# Patient Record
Sex: Female | Born: 1960 | Race: Black or African American | Hispanic: No | Marital: Single | Smoking: Former smoker
Health system: Southern US, Community
[De-identification: ages and names within clinical notes are randomized; demographics above are authoritative.]

## PROBLEM LIST (undated history)

## (undated) DIAGNOSIS — I10 Essential (primary) hypertension: Secondary | ICD-10-CM

## (undated) DIAGNOSIS — J189 Pneumonia, unspecified organism: Secondary | ICD-10-CM

## (undated) DIAGNOSIS — E7800 Pure hypercholesterolemia, unspecified: Secondary | ICD-10-CM

## (undated) HISTORY — PX: TONSILLECTOMY: SUR1361

## (undated) HISTORY — PX: TUBAL LIGATION: SHX77

---

## 2013-02-23 ENCOUNTER — Emergency Department (HOSPITAL_BASED_OUTPATIENT_CLINIC_OR_DEPARTMENT_OTHER): Payer: No Typology Code available for payment source

## 2013-02-23 ENCOUNTER — Emergency Department (HOSPITAL_BASED_OUTPATIENT_CLINIC_OR_DEPARTMENT_OTHER)
Admission: EM | Admit: 2013-02-23 | Discharge: 2013-02-23 | Disposition: A | Discharge status: Home / Self Care | Payer: No Typology Code available for payment source | Attending: Emergency Medicine | Admitting: Emergency Medicine

## 2013-02-23 ENCOUNTER — Encounter (HOSPITAL_BASED_OUTPATIENT_CLINIC_OR_DEPARTMENT_OTHER): Payer: Self-pay | Admitting: Emergency Medicine

## 2013-02-23 DIAGNOSIS — Y9389 Activity, other specified: Secondary | ICD-10-CM | POA: Insufficient documentation

## 2013-02-23 DIAGNOSIS — Z9851 Tubal ligation status: Secondary | ICD-10-CM | POA: Insufficient documentation

## 2013-02-23 DIAGNOSIS — S3981XA Other specified injuries of abdomen, initial encounter: Secondary | ICD-10-CM | POA: Insufficient documentation

## 2013-02-23 DIAGNOSIS — E7800 Pure hypercholesterolemia, unspecified: Secondary | ICD-10-CM | POA: Insufficient documentation

## 2013-02-23 DIAGNOSIS — S46909A Unspecified injury of unspecified muscle, fascia and tendon at shoulder and upper arm level, unspecified arm, initial encounter: Secondary | ICD-10-CM | POA: Insufficient documentation

## 2013-02-23 DIAGNOSIS — Z91040 Latex allergy status: Secondary | ICD-10-CM | POA: Insufficient documentation

## 2013-02-23 DIAGNOSIS — S4980XA Other specified injuries of shoulder and upper arm, unspecified arm, initial encounter: Secondary | ICD-10-CM | POA: Insufficient documentation

## 2013-02-23 DIAGNOSIS — Z79899 Other long term (current) drug therapy: Secondary | ICD-10-CM | POA: Insufficient documentation

## 2013-02-23 DIAGNOSIS — Y92410 Unspecified street and highway as the place of occurrence of the external cause: Secondary | ICD-10-CM | POA: Insufficient documentation

## 2013-02-23 HISTORY — DX: Pure hypercholesterolemia, unspecified: E78.00

## 2013-02-23 LAB — COMPREHENSIVE METABOLIC PANEL
ALK PHOS: 92 U/L (ref 39–117)
ALT: 8 U/L (ref 0–35)
AST: 11 U/L (ref 0–37)
Albumin: 3.5 g/dL (ref 3.5–5.2)
BILIRUBIN TOTAL: 0.2 mg/dL — AB (ref 0.3–1.2)
BUN: 9 mg/dL (ref 6–23)
CALCIUM: 9.1 mg/dL (ref 8.4–10.5)
CO2: 23 meq/L (ref 19–32)
Chloride: 103 mEq/L (ref 96–112)
Creatinine, Ser: 0.8 mg/dL (ref 0.50–1.10)
GFR, EST NON AFRICAN AMERICAN: 85 mL/min — AB (ref >90)
GLUCOSE: 103 mg/dL — AB (ref 70–99)
Potassium: 4.1 mEq/L (ref 3.7–5.3)
SODIUM: 140 meq/L (ref 137–147)
Total Protein: 7.5 g/dL (ref 6.0–8.3)

## 2013-02-23 LAB — URINALYSIS, ROUTINE W REFLEX MICROSCOPIC
BILIRUBIN URINE: NEGATIVE
Glucose, UA: NEGATIVE mg/dL
Hgb urine dipstick: NEGATIVE
KETONES UR: NEGATIVE mg/dL
Leukocytes, UA: NEGATIVE
Nitrite: NEGATIVE
Protein, ur: NEGATIVE mg/dL
SPECIFIC GRAVITY, URINE: 1.018 (ref 1.005–1.030)
UROBILINOGEN UA: 1 mg/dL (ref 0.0–1.0)
pH: 6 (ref 5.0–8.0)

## 2013-02-23 LAB — CBC WITH DIFFERENTIAL/PLATELET
Basophils Absolute: 0 10*3/uL (ref 0.0–0.1)
Basophils Relative: 0 % (ref 0–1)
EOS PCT: 2 % (ref 0–5)
Eosinophils Absolute: 0.1 10*3/uL (ref 0.0–0.7)
HEMATOCRIT: 37.9 % (ref 36.0–46.0)
Hemoglobin: 12.4 g/dL (ref 12.0–15.0)
LYMPHS PCT: 47 % — AB (ref 12–46)
Lymphs Abs: 3.3 10*3/uL (ref 0.7–4.0)
MCH: 29.7 pg (ref 26.0–34.0)
MCHC: 32.7 g/dL (ref 30.0–36.0)
MCV: 90.9 fL (ref 78.0–100.0)
Monocytes Absolute: 0.6 10*3/uL (ref 0.1–1.0)
Monocytes Relative: 8 % (ref 3–12)
Neutro Abs: 3 10*3/uL (ref 1.7–7.7)
Neutrophils Relative %: 43 % (ref 43–77)
PLATELETS: 392 10*3/uL (ref 150–400)
RBC: 4.17 MIL/uL (ref 3.87–5.11)
RDW: 14.9 % (ref 11.5–15.5)
WBC: 6.9 10*3/uL (ref 4.0–10.5)

## 2013-02-23 MED ORDER — IOHEXOL 300 MG/ML  SOLN
100.0000 mL | Freq: Once | INTRAMUSCULAR | Status: AC | PRN
  Administered 2013-02-23: 100 mL via INTRAVENOUS

## 2013-02-23 MED ORDER — HYDROCODONE-ACETAMINOPHEN 5-325 MG PO TABS: 2.0000 | ORAL_TABLET | Freq: Four times a day (QID) | ORAL | Status: DC | PRN

## 2013-02-23 MED ORDER — IBUPROFEN 800 MG PO TABS
800.0000 mg | ORAL_TABLET | Freq: Once | ORAL | Status: AC
  Administered 2013-02-23: 800 mg via ORAL
  Filled 2013-02-23: qty 1

## 2013-02-23 MED ORDER — IBUPROFEN 800 MG PO TABS: 800.0000 mg | ORAL_TABLET | Freq: Three times a day (TID) | ORAL | Status: DC | PRN
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
## 2013-02-23 NOTE — ED Notes (Signed)
Patient transported to CT 
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
## 2014-04-02 ENCOUNTER — Encounter (HOSPITAL_BASED_OUTPATIENT_CLINIC_OR_DEPARTMENT_OTHER): Payer: Self-pay | Admitting: *Deleted

## 2014-04-02 ENCOUNTER — Emergency Department (HOSPITAL_BASED_OUTPATIENT_CLINIC_OR_DEPARTMENT_OTHER)
Admission: EM | Admit: 2014-04-02 | Discharge: 2014-04-02 | Disposition: A | Discharge status: Home / Self Care | Payer: Medicaid Other | Attending: Emergency Medicine | Admitting: Emergency Medicine

## 2014-04-02 DIAGNOSIS — R51 Headache: Secondary | ICD-10-CM

## 2014-04-02 DIAGNOSIS — I10 Essential (primary) hypertension: Secondary | ICD-10-CM | POA: Diagnosis not present

## 2014-04-02 DIAGNOSIS — Z8639 Personal history of other endocrine, nutritional and metabolic disease: Secondary | ICD-10-CM | POA: Diagnosis not present

## 2014-04-02 DIAGNOSIS — Z87891 Personal history of nicotine dependence: Secondary | ICD-10-CM | POA: Diagnosis not present

## 2014-04-02 DIAGNOSIS — Y9389 Activity, other specified: Secondary | ICD-10-CM | POA: Diagnosis not present

## 2014-04-02 DIAGNOSIS — R519 Headache, unspecified: Secondary | ICD-10-CM

## 2014-04-02 DIAGNOSIS — S0990XA Unspecified injury of head, initial encounter: Secondary | ICD-10-CM | POA: Diagnosis not present

## 2014-04-02 DIAGNOSIS — Z791 Long term (current) use of non-steroidal anti-inflammatories (NSAID): Secondary | ICD-10-CM | POA: Insufficient documentation

## 2014-04-02 DIAGNOSIS — Y92410 Unspecified street and highway as the place of occurrence of the external cause: Secondary | ICD-10-CM | POA: Diagnosis not present

## 2014-04-02 DIAGNOSIS — S4991XA Unspecified injury of right shoulder and upper arm, initial encounter: Secondary | ICD-10-CM | POA: Insufficient documentation

## 2014-04-02 DIAGNOSIS — Z79899 Other long term (current) drug therapy: Secondary | ICD-10-CM | POA: Diagnosis not present

## 2014-04-02 DIAGNOSIS — Z91040 Latex allergy status: Secondary | ICD-10-CM | POA: Insufficient documentation

## 2014-04-02 DIAGNOSIS — Y998 Other external cause status: Secondary | ICD-10-CM | POA: Insufficient documentation

## 2014-04-02 DIAGNOSIS — M79601 Pain in right arm: Secondary | ICD-10-CM

## 2014-04-02 HISTORY — DX: Essential (primary) hypertension: I10

## 2014-04-02 MED ORDER — IBUPROFEN 800 MG PO TABS
800.0000 mg | ORAL_TABLET | Freq: Once | ORAL | Status: AC
  Administered 2014-04-02: 800 mg via ORAL
  Filled 2014-04-02: qty 1

## 2014-04-02 MED ORDER — IBUPROFEN 800 MG PO TABS: 800.0000 mg | ORAL_TABLET | Freq: Three times a day (TID) | ORAL | Status: DC
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
## 2015-06-22 ENCOUNTER — Encounter (HOSPITAL_BASED_OUTPATIENT_CLINIC_OR_DEPARTMENT_OTHER): Payer: Self-pay | Admitting: *Deleted

## 2015-06-22 ENCOUNTER — Emergency Department (HOSPITAL_BASED_OUTPATIENT_CLINIC_OR_DEPARTMENT_OTHER)
Admission: EM | Admit: 2015-06-22 | Discharge: 2015-06-22 | Disposition: A | Discharge status: Home / Self Care | Payer: Medicaid Other | Attending: Emergency Medicine | Admitting: Emergency Medicine

## 2015-06-22 DIAGNOSIS — E7800 Pure hypercholesterolemia, unspecified: Secondary | ICD-10-CM | POA: Diagnosis not present

## 2015-06-22 DIAGNOSIS — Y999 Unspecified external cause status: Secondary | ICD-10-CM | POA: Diagnosis not present

## 2015-06-22 DIAGNOSIS — Z87891 Personal history of nicotine dependence: Secondary | ICD-10-CM | POA: Diagnosis not present

## 2015-06-22 DIAGNOSIS — Y92410 Unspecified street and highway as the place of occurrence of the external cause: Secondary | ICD-10-CM | POA: Diagnosis not present

## 2015-06-22 DIAGNOSIS — I10 Essential (primary) hypertension: Secondary | ICD-10-CM | POA: Insufficient documentation

## 2015-06-22 DIAGNOSIS — Y939 Activity, unspecified: Secondary | ICD-10-CM | POA: Diagnosis not present

## 2015-06-22 DIAGNOSIS — R51 Headache: Secondary | ICD-10-CM | POA: Insufficient documentation

## 2015-06-22 DIAGNOSIS — R519 Headache, unspecified: Secondary | ICD-10-CM

## 2015-06-22 MED ORDER — NAPROXEN 500 MG PO TABS: 500.0000 mg | ORAL_TABLET | Freq: Two times a day (BID) | ORAL | Status: DC

## 2015-06-22 MED ORDER — METHOCARBAMOL 500 MG PO TABS: 500.0000 mg | ORAL_TABLET | Freq: Two times a day (BID) | ORAL | Status: DC
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
## 2015-06-22 NOTE — Discharge Instructions (Signed)
Take the prescribed medication as directed. °Follow-up with your primary care doctor. °Return to the ED for new or worsening symptoms. °
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
## 2016-04-04 ENCOUNTER — Encounter (HOSPITAL_BASED_OUTPATIENT_CLINIC_OR_DEPARTMENT_OTHER): Payer: Self-pay

## 2016-04-04 ENCOUNTER — Emergency Department (HOSPITAL_BASED_OUTPATIENT_CLINIC_OR_DEPARTMENT_OTHER)
Admission: EM | Admit: 2016-04-04 | Discharge: 2016-04-04 | Disposition: A | Discharge status: Home / Self Care | Payer: Medicaid Other | Attending: Emergency Medicine | Admitting: Emergency Medicine

## 2016-04-04 DIAGNOSIS — Z23 Encounter for immunization: Secondary | ICD-10-CM | POA: Diagnosis not present

## 2016-04-04 DIAGNOSIS — Y939 Activity, unspecified: Secondary | ICD-10-CM | POA: Insufficient documentation

## 2016-04-04 DIAGNOSIS — S61210A Laceration without foreign body of right index finger without damage to nail, initial encounter: Secondary | ICD-10-CM | POA: Insufficient documentation

## 2016-04-04 DIAGNOSIS — S61011A Laceration without foreign body of right thumb without damage to nail, initial encounter: Secondary | ICD-10-CM | POA: Insufficient documentation

## 2016-04-04 DIAGNOSIS — I10 Essential (primary) hypertension: Secondary | ICD-10-CM | POA: Insufficient documentation

## 2016-04-04 DIAGNOSIS — Y92009 Unspecified place in unspecified non-institutional (private) residence as the place of occurrence of the external cause: Secondary | ICD-10-CM | POA: Diagnosis not present

## 2016-04-04 DIAGNOSIS — Y999 Unspecified external cause status: Secondary | ICD-10-CM | POA: Diagnosis not present

## 2016-04-04 DIAGNOSIS — Z87891 Personal history of nicotine dependence: Secondary | ICD-10-CM | POA: Diagnosis not present

## 2016-04-04 DIAGNOSIS — W268XXA Contact with other sharp object(s), not elsewhere classified, initial encounter: Secondary | ICD-10-CM | POA: Diagnosis not present

## 2016-04-04 DIAGNOSIS — S6991XA Unspecified injury of right wrist, hand and finger(s), initial encounter: Secondary | ICD-10-CM | POA: Diagnosis present

## 2016-04-04 DIAGNOSIS — S61411A Laceration without foreign body of right hand, initial encounter: Secondary | ICD-10-CM

## 2016-04-04 MED ORDER — TETANUS-DIPHTH-ACELL PERTUSSIS 5-2.5-18.5 LF-MCG/0.5 IM SUSP
0.5000 mL | Freq: Once | INTRAMUSCULAR | Status: AC
Start: 2016-04-04 — End: 2016-04-04
  Administered 2016-04-04: 0.5 mL via INTRAMUSCULAR
  Filled 2016-04-04: qty 0.5
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
## 2016-04-04 NOTE — ED Provider Notes (Signed)
[facility]   CSN: [id number] Arrival date & time: 04/04/16  1302     History   Chief Complaint Chief Complaint  Patient presents with  . Hand Injury    HPI Hannah Simpson is a 56 y.o. female.  HPI 56 y.o. female, presents to the Emergency Department today complaining of cutting right hand on door knob PTA this morning. Notes laceration on thumb and index finger. Bleeding controlled with direct pressure. No decrease in ROM. No numbness/tingling. No glass shards noted in wound. Notes minimal pain currently. No meds PTA.   Past Medical History:  Diagnosis Date  . Hypercholesteremia   . Hypertension     There are no active problems to display for this patient.   Past Surgical History:  Procedure Laterality Date  . TUBAL LIGATION      OB History    No data available       Home Medications    Prior to Admission medications   Not on File    Family History No family history on file.  Social History Social History  Substance Use Topics  . Smoking status: Former Games developer  . Smokeless tobacco: Never Used  . Alcohol use No     Allergies   Latex   Review of Systems Review of Systems  Constitutional: Negative for fever.  Skin: Positive for wound.  Neurological: Negative for numbness.   Physical Exam Updated Vital Signs BP 141/85 (BP Location: Left Arm)   Pulse (!) 55   Temp 98.5 F (36.9 C) (Oral)   Resp 18   Ht 5\' 9"  (1.753 m)   Wt 96.2 kg   LMP 03/28/2016   SpO2 100%   BMI 31.31 kg/m   Physical Exam  Constitutional: She is oriented to person, place, and time. Vital signs are normal. She appears well-developed and well-nourished.  HENT:  Head: Normocephalic.  Right Ear: Hearing normal.  Left Ear: Hearing normal.  Eyes: Conjunctivae and EOM are normal. Pupils are equal, round, and reactive to light.  Cardiovascular: Normal rate and regular rhythm.   Pulmonary/Chest: Effort normal.  Musculoskeletal:  Right Index and  Thumb with small 1 cm superficial lacerations that are well approximated. No foreign bodies noted. No bleeding. ROM intact. NVI. Cap refill <2sec.   Neurological: She is alert and oriented to person, place, and time.  Skin: Skin is warm and dry.  Psychiatric: She has a normal mood and affect. Her speech is normal and behavior is normal. Thought content normal.  Nursing note and vitals reviewed.  ED Treatments / Results  Labs (all labs ordered are listed, but only abnormal results are displayed) Labs Reviewed - No data to display  EKG  EKG Interpretation None      Radiology No results found.  Procedures Procedures (including critical care time)  Medications Ordered in ED Medications  Tdap (BOOSTRIX) injection 0.5 mL (0.5 mLs Intramuscular Given 04/04/16 1429)   Initial Impression / Assessment and Plan / ED Course  I have reviewed the triage vital signs and the nursing notes.  Pertinent labs & imaging results that were available during my care of the patient were reviewed by me and considered in my medical decision making (see chart for details).  Final Clinical Impressions(s) / ED Diagnoses     {I have reviewed the relevant previous healthcare records.  {I obtained HPI from historian.   ED Course:  Assessment: Patient is a 56 y.o. female that presents with laceration to right thumb and  index finger due to door knob. Superficial in nature. Bleeding controlled. Wound well approximated. Tdap booster given. Pressure irrigation performed. Bottom of the wound visualized with bleeding controled. Laceration occurred < 8 hours prior to repair which was well tolerated. Pt has no co morbidities to effect normal wound healing. Closed wound with Dermabond.  Pt is hemodynamically stable w no complaints prior to dc.    Disposition/Plan:  DC Home Additional Verbal discharge instructions given and discussed with patient.  Pt Instructed to f/u with PCP in the next week for evaluation and  treatment of symptoms. Return precautions given Pt acknowledges and agrees with plan  Supervising Physician Vanetta MuldersScott Zackowski, MD  Final diagnoses:  Laceration of right hand without foreign body, initial encounter    New Prescriptions New Prescriptions   No medications on file     Audry Piliyler Glyn Zendejas, PA-C 04/04/16 1704    Vanetta MuldersScott Zackowski, MD 04/06/16 [phone]

## 2018-08-17 ENCOUNTER — Other Ambulatory Visit: Payer: Self-pay

## 2018-08-17 ENCOUNTER — Emergency Department (HOSPITAL_BASED_OUTPATIENT_CLINIC_OR_DEPARTMENT_OTHER): Payer: Medicaid Other

## 2018-08-17 ENCOUNTER — Encounter (HOSPITAL_BASED_OUTPATIENT_CLINIC_OR_DEPARTMENT_OTHER): Payer: Self-pay

## 2018-08-17 ENCOUNTER — Emergency Department (HOSPITAL_BASED_OUTPATIENT_CLINIC_OR_DEPARTMENT_OTHER)
Admission: EM | Admit: 2018-08-17 | Discharge: 2018-08-17 | Disposition: A | Discharge status: Home / Self Care | Payer: Medicaid Other | Attending: Emergency Medicine | Admitting: Emergency Medicine

## 2018-08-17 DIAGNOSIS — M25511 Pain in right shoulder: Secondary | ICD-10-CM | POA: Diagnosis present

## 2018-08-17 DIAGNOSIS — Z87891 Personal history of nicotine dependence: Secondary | ICD-10-CM | POA: Insufficient documentation

## 2018-08-17 DIAGNOSIS — Z79899 Other long term (current) drug therapy: Secondary | ICD-10-CM | POA: Diagnosis not present

## 2018-08-17 DIAGNOSIS — Z91040 Latex allergy status: Secondary | ICD-10-CM | POA: Insufficient documentation

## 2018-08-17 DIAGNOSIS — M7918 Myalgia, other site: Secondary | ICD-10-CM

## 2018-08-17 DIAGNOSIS — I10 Essential (primary) hypertension: Secondary | ICD-10-CM | POA: Diagnosis not present

## 2018-08-17 MED ORDER — CYCLOBENZAPRINE HCL 10 MG PO TABS: 10.0000 mg | ORAL_TABLET | Freq: Two times a day (BID) | ORAL | 0 refills | Status: AC | PRN

## 2018-08-17 NOTE — Discharge Instructions (Addendum)
X-ray did not show a fracture. 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 Recommend Tylenol, Motrin.  Use Flexeril as needed for pain as well.
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
## 2018-09-14 ENCOUNTER — Emergency Department (HOSPITAL_BASED_OUTPATIENT_CLINIC_OR_DEPARTMENT_OTHER)
Admission: EM | Admit: 2018-09-14 | Discharge: 2018-09-14 | Disposition: A | Discharge status: Home / Self Care | Payer: Medicaid Other | Attending: Emergency Medicine | Admitting: Emergency Medicine

## 2018-09-14 ENCOUNTER — Encounter (HOSPITAL_BASED_OUTPATIENT_CLINIC_OR_DEPARTMENT_OTHER): Payer: Self-pay

## 2018-09-14 ENCOUNTER — Other Ambulatory Visit: Payer: Self-pay

## 2018-09-14 DIAGNOSIS — M25511 Pain in right shoulder: Secondary | ICD-10-CM | POA: Diagnosis not present

## 2018-09-14 DIAGNOSIS — Z5321 Procedure and treatment not carried out due to patient leaving prior to being seen by health care provider: Secondary | ICD-10-CM | POA: Diagnosis not present
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
## 2019-02-07 ENCOUNTER — Encounter (HOSPITAL_BASED_OUTPATIENT_CLINIC_OR_DEPARTMENT_OTHER): Payer: Self-pay | Admitting: Emergency Medicine

## 2019-02-07 ENCOUNTER — Emergency Department (HOSPITAL_BASED_OUTPATIENT_CLINIC_OR_DEPARTMENT_OTHER): Payer: Medicaid Other

## 2019-02-07 ENCOUNTER — Emergency Department (HOSPITAL_BASED_OUTPATIENT_CLINIC_OR_DEPARTMENT_OTHER)
Admission: EM | Admit: 2019-02-07 | Discharge: 2019-02-07 | Disposition: A | Discharge status: Home / Self Care | Payer: Medicaid Other | Attending: Emergency Medicine | Admitting: Emergency Medicine

## 2019-02-07 ENCOUNTER — Other Ambulatory Visit: Payer: Self-pay

## 2019-02-07 DIAGNOSIS — Z87891 Personal history of nicotine dependence: Secondary | ICD-10-CM | POA: Insufficient documentation

## 2019-02-07 DIAGNOSIS — Z91040 Latex allergy status: Secondary | ICD-10-CM | POA: Diagnosis not present

## 2019-02-07 DIAGNOSIS — J189 Pneumonia, unspecified organism: Secondary | ICD-10-CM

## 2019-02-07 DIAGNOSIS — I10 Essential (primary) hypertension: Secondary | ICD-10-CM | POA: Insufficient documentation

## 2019-02-07 DIAGNOSIS — E782 Mixed hyperlipidemia: Secondary | ICD-10-CM | POA: Insufficient documentation

## 2019-02-07 DIAGNOSIS — U071 COVID-19: Secondary | ICD-10-CM | POA: Diagnosis not present

## 2019-02-07 DIAGNOSIS — Z79899 Other long term (current) drug therapy: Secondary | ICD-10-CM | POA: Insufficient documentation

## 2019-02-07 DIAGNOSIS — R05 Cough: Secondary | ICD-10-CM | POA: Diagnosis present

## 2019-02-07 DIAGNOSIS — J1289 Other viral pneumonia: Secondary | ICD-10-CM | POA: Diagnosis not present

## 2019-02-07 HISTORY — DX: Pneumonia, unspecified organism: J18.9

## 2019-02-07 LAB — CBC WITH DIFFERENTIAL/PLATELET
Abs Immature Granulocytes: 0.01 10*3/uL (ref 0.00–0.07)
Basophils Absolute: 0 10*3/uL (ref 0.0–0.1)
Basophils Relative: 1 %
Eosinophils Absolute: 0 10*3/uL (ref 0.0–0.5)
Eosinophils Relative: 0 %
HCT: 39.6 % (ref 36.0–46.0)
Hemoglobin: 12.7 g/dL (ref 12.0–15.0)
Immature Granulocytes: 0 %
Lymphocytes Relative: 42 %
Lymphs Abs: 2.5 10*3/uL (ref 0.7–4.0)
MCH: 28.6 pg (ref 26.0–34.0)
MCHC: 32.1 g/dL (ref 30.0–36.0)
MCV: 89.2 fL (ref 80.0–100.0)
Monocytes Absolute: 0.8 10*3/uL (ref 0.1–1.0)
Monocytes Relative: 13 %
Neutro Abs: 2.7 10*3/uL (ref 1.7–7.7)
Neutrophils Relative %: 44 %
Platelets: 329 10*3/uL (ref 150–400)
RBC: 4.44 MIL/uL (ref 3.87–5.11)
RDW: 15.6 % — ABNORMAL HIGH (ref 11.5–15.5)
WBC: 6 10*3/uL (ref 4.0–10.5)
nRBC: 0 % (ref 0.0–0.2)

## 2019-02-07 LAB — COMPREHENSIVE METABOLIC PANEL
ALT: 14 U/L (ref 0–44)
AST: 18 U/L (ref 15–41)
Albumin: 3.7 g/dL (ref 3.5–5.0)
Alkaline Phosphatase: 73 U/L (ref 38–126)
Anion gap: 9 (ref 5–15)
BUN: 10 mg/dL (ref 6–20)
CO2: 27 mmol/L (ref 22–32)
Calcium: 8.7 mg/dL — ABNORMAL LOW (ref 8.9–10.3)
Chloride: 100 mmol/L (ref 98–111)
Creatinine, Ser: 0.74 mg/dL (ref 0.44–1.00)
GFR calc Af Amer: >60 mL/min (ref >60)
GFR calc non Af Amer: >60 mL/min (ref >60)
Glucose, Bld: 110 mg/dL — ABNORMAL HIGH (ref 70–99)
Potassium: 3.3 mmol/L — ABNORMAL LOW (ref 3.5–5.1)
Sodium: 136 mmol/L (ref 135–145)
Total Bilirubin: 0.4 mg/dL (ref 0.3–1.2)
Total Protein: 7.5 g/dL (ref 6.5–8.1)

## 2019-02-07 LAB — SARS CORONAVIRUS 2 AG (30 MIN TAT): SARS Coronavirus 2 Ag: POSITIVE — AB

## 2019-02-07 LAB — HCG, QUANTITATIVE, PREGNANCY: hCG, Beta Chain, Quant, S: 1 m[IU]/mL (ref <5)

## 2019-02-07 LAB — LIPASE, BLOOD: Lipase: 26 U/L (ref 11–51)

## 2019-02-07 LAB — TROPONIN I (HIGH SENSITIVITY): Troponin I (High Sensitivity): 7 ng/L (ref <18)

## 2019-02-07 LAB — PREGNANCY, URINE: Preg Test, Ur: NEGATIVE

## 2019-02-07 MED ORDER — SODIUM CHLORIDE 0.9 % IV SOLN
500.0000 mg | Freq: Once | INTRAVENOUS | Status: AC
  Administered 2019-02-07: 500 mg via INTRAVENOUS
  Filled 2019-02-07: qty 500

## 2019-02-07 MED ORDER — SODIUM CHLORIDE 0.9 % IV SOLN
1.0000 g | Freq: Once | INTRAVENOUS | Status: AC
  Administered 2019-02-07: 1 g via INTRAVENOUS
  Filled 2019-02-07: qty 10

## 2019-02-07 MED ORDER — AZITHROMYCIN 250 MG PO TABS: 250.0000 mg | ORAL_TABLET | Freq: Every day | ORAL | 0 refills | Status: AC

## 2019-02-07 MED ORDER — SODIUM CHLORIDE 0.9 % IV BOLUS
500.0000 mL | Freq: Once | INTRAVENOUS | Status: AC
  Administered 2019-02-07: 500 mL via INTRAVENOUS

## 2019-02-07 MED ORDER — IOHEXOL 350 MG/ML SOLN
100.0000 mL | Freq: Once | INTRAVENOUS | Status: AC | PRN
  Administered 2019-02-07: 100 mL via INTRAVENOUS

## 2019-02-07 NOTE — ED Notes (Signed)
Pt ambulated in room maintaining O2 sat 95-97 %, provider made aware

## 2019-02-07 NOTE — Discharge Instructions (Signed)
You have been diagnosed today with COVID and pneumonia.  At this time there does not appear to be the presence of an emergent medical condition, however there is always the potential for conditions to change. Please read and follow the below instructions.  Please return to the Emergency Department immediately for any new or worsening symptoms. Please be sure to follow up with your Primary Care Provider within one week regarding your visit today; please call their office to schedule an appointment even if you are feeling better for a follow-up visit. Your Covid test was positive today.  Please call your primary care provider's office today to inform them of your positive result and to schedule a follow-up appointment for recheck. You have been given IV antibiotics today, you may continue the antibiotic pill azithromycin starting tomorrow, 1 pill daily until complete. Please drink plenty of water and get plenty of rest.  Continue quarantining to avoid spread of the virus.  Get help right away if: You are short of breath You have chest pain. Your sickness gets worse. This is very serious if: You are an older adult. Your body's defense system is weak. You cough up blood. You have trouble breathing. You have pain or pressure in your chest. You have confusion. You have bluish lips and fingernails. You have difficulty waking from sleep. You have any new/concerning or worsening of symptoms  Please read the additional information packets attached to your discharge summary.  Do not take your medicine if  develop an itchy rash, swelling in your mouth or lips, or difficulty breathing; call 911 and seek immediate emergency medical attention if this occurs.  Note: Portions of this text may have been transcribed using voice recognition software. Every effort was made to ensure accuracy; however, inadvertent computerized transcription errors may still be present.

## 2019-02-07 NOTE — ED Provider Notes (Signed)
[facility] EMERGENCY DEPARTMENT Provider Note   CSN: [id number] Arrival date & time: 02/07/19  7867     History Chief Complaint  Patient presents with  . Cough    Hannah Simpson is a 59 y.o. female history of hypertension, hypercholesterolemia presents today requesting Covid test.  Patient reports that over the past 4 days she has developed a mild cough productive with white sputum, intermittent, no aggravating or alleviating factors.  Patient reports that she has developed a mild central chest sharp/burning sensation over the past 2-3 days only with cough improved with rest, nonradiating.  Of note patient reports that her granddaughter tested positive for COVID-19 virus last week.  She denies fever prior to arrival, headache, vision changes, neck stiffness, shortness of breath, hemoptysis, history of blood clot, abdominal pain, nausea/vomiting, diarrhea, fall/injury, numbness/weakness, tingling, extremity swelling/color change or any additional concerns.  HPI     Past Medical History:  Diagnosis Date  . Hypercholesteremia   . Hypertension   . Pneumonia     There are no problems to display for this patient.   Past Surgical History:  Procedure Laterality Date  . TUBAL LIGATION       OB History   No obstetric history on file.     No family history on file.  Social History   Tobacco Use  . Smoking status: Former Games developer  . Smokeless tobacco: Never Used  Substance Use Topics  . Alcohol use: No  . Drug use: No    Home Medications Prior to Admission medications   Medication Sig Start Date End Date Taking? Authorizing Provider  azithromycin (ZITHROMAX) 250 MG tablet Take 1 tablet (250 mg total) by mouth daily. You have already been given your first dose of antibiotics today.  You may begin taking the antibiotic azithromycin as prescribed 1 pill daily starting on January 8 until complete. 02/08/19   Bill Salinas, PA-C  hydrochlorothiazide (HYDRODIURIL)  25 MG tablet TK 1 T PO D 05/01/17   [provider]  omeprazole (PRILOSEC) 40 MG capsule TK 1 C PO D 03/05/17   [provider]    Allergies    Latex  Review of Systems   Review of Systems Ten systems are reviewed and are negative for acute change except as noted in the HPI  Physical Exam Updated Vital Signs BP 120/81 (BP Location: Right Arm)   Pulse 85   Temp (!) 100.4 F (38 C) (Oral)   Resp 18   Ht 5\' 9"  (1.753 m)   Wt 103.3 kg   LMP 01/24/2019   SpO2 100%   BMI 33.64 kg/m   Physical Exam Constitutional:      General: She is not in acute distress.    Appearance: Normal appearance. She is well-developed. She is not ill-appearing or diaphoretic.  HENT:     Head: Normocephalic and atraumatic.     Jaw: There is normal jaw occlusion.     Right Ear: Tympanic membrane and external ear normal.     Left Ear: Tympanic membrane and external ear normal.     Nose: Nose normal.     Mouth/Throat:     Comments: The patient has normal phonation and is in control of secretions. No stridor.  Midline uvula without edema. Soft palate rises symmetrically. No tonsillar erythema, swelling or exudates. Tongue protrusion is normal, floor of mouth is soft. No trismus. No creptius on neck palpation. No gingival erythema or fluctuance noted. Mucus membranes moist. No pallor noted. Eyes:  General: Vision grossly intact. Gaze aligned appropriately.     Pupils: Pupils are equal, round, and reactive to light.  Neck:     Trachea: Trachea and phonation normal. No tracheal deviation.  Cardiovascular:     Rate and Rhythm: Normal rate and regular rhythm.     Pulses: Normal pulses.     Heart sounds: Normal heart sounds.  Pulmonary:     Effort: Pulmonary effort is normal. No respiratory distress.     Breath sounds: Normal breath sounds.  Abdominal:     General: There is no distension.     Palpations: Abdomen is soft.     Tenderness: There is no abdominal tenderness. There is no  guarding or rebound.  Musculoskeletal:        General: No tenderness. Normal range of motion.     Cervical back: Normal range of motion.     Right lower leg: No edema.     Left lower leg: No edema.  Skin:    General: Skin is warm and dry.  Neurological:     Mental Status: She is alert.     GCS: GCS eye subscore is 4. GCS verbal subscore is 5. GCS motor subscore is 6.     Comments: Speech is clear and goal oriented, follows commands Major Cranial nerves without deficit, no facial droop Moves extremities without ataxia, coordination intact  Psychiatric:        Behavior: Behavior normal.     ED Results / Procedures / Treatments   Labs (all labs ordered are listed, but only abnormal results are displayed) Labs Reviewed  SARS CORONAVIRUS 2 AG (30 MIN TAT) - Abnormal; Notable for the following components:      Result Value   SARS Coronavirus 2 Ag POSITIVE (*)    All other components within normal limits  CBC WITH DIFFERENTIAL/PLATELET - Abnormal; Notable for the following components:   RDW 15.6 (*)    All other components within normal limits  COMPREHENSIVE METABOLIC PANEL - Abnormal; Notable for the following components:   Potassium 3.3 (*)    Glucose, Bld 110 (*)    Calcium 8.7 (*)    All other components within normal limits  LIPASE, BLOOD  HCG, QUANTITATIVE, PREGNANCY  PREGNANCY, URINE  TROPONIN I (HIGH SENSITIVITY)    EKG EKG Interpretation  Date/Time:  Thursday February 07 2019 10:12:15 EST Ventricular Rate:  80 PR Interval:    QRS Duration: 91 QT Interval:  357 QTC Calculation: 412 R Axis:   40 Text Interpretation: Sinus rhythm Probable left atrial enlargement no prior available for comparison Confirmed by Quintella Reichert [phone]) on 02/07/2019 10:26:53 AM   Radiology CT Angio Chest PE W/Cm &/Or Wo Cm  Result Date: 02/07/2019 CLINICAL DATA:  Cough.  Shortness of breath.  COVID positive. EXAM: CT ANGIOGRAPHY CHEST WITH CONTRAST TECHNIQUE: Multidetector CT imaging  of the chest was performed using the standard protocol during bolus administration of intravenous contrast. Multiplanar CT image reconstructions and MIPs were obtained to evaluate the vascular anatomy. CONTRAST:  129mL OMNIPAQUE IOHEXOL 350 MG/ML SOLN COMPARISON:  None. FINDINGS: Cardiovascular: No filling defects in the pulmonary arteries to suggestpulmonary emboli. Heart is normal size. Aorta is normal caliber. Mediastinum/Nodes: No mediastinal, hilar, or axillary adenopathy. Trachea and esophagus are unremarkable. Thyroid unremarkable. Lungs/Pleura: Airspace disease noted in the right lower lobe compatible with pneumonia. Minimal ground-glass densities in the left lower lobe could reflect early infiltrate/pneumonia. No effusions or pneumothorax. Upper Abdomen: Imaging into the upper abdomen shows no acute  findings. Musculoskeletal: Chest wall soft tissues are unremarkable. No acute bony abnormality. Review of the MIP images confirms the above findings. IMPRESSION: No evidence of pulmonary embolus. Airspace disease in the right lower lobe compatible with pneumonia. Minimal ground-glass densities peripherally in the left lower lobe could reflect early pneumonia. Electronically Signed   By: Charlett Nose M.D.   On: 02/07/2019 11:49   DG Chest Portable 1 View  Result Date: 02/07/2019 CLINICAL DATA:  Cough EXAM: PORTABLE CHEST 1 VIEW COMPARISON:  None. FINDINGS: Patchy vague airspace opacity noted in the right lower lung concerning for pneumonia. No confluent opacity on the left. Heart is normal size. No effusions or acute bony abnormality. IMPRESSION: Patchy right basilar opacity concerning for pneumonia. Electronically Signed   By: Charlett Nose M.D.   On: 02/07/2019 09:57    Procedures Procedures (including critical care time)  Medications Ordered in ED Medications  cefTRIAXone (ROCEPHIN) 1 g in sodium chloride 0.9 % 100 mL IVPB (0 g Intravenous Stopped 02/07/19 1106)  azithromycin (ZITHROMAX) 500 mg in  sodium chloride 0.9 % 250 mL IVPB (0 mg Intravenous Stopped 02/07/19 1213)  sodium chloride 0.9 % bolus 500 mL (0 mLs Intravenous Stopped 02/07/19 1155)  iohexol (OMNIPAQUE) 350 MG/ML injection 100 mL (100 mLs Intravenous Contrast Given 02/07/19 1144)    ED Course  I have reviewed the triage vital signs and the nursing notes.  Pertinent labs & imaging results that were available during my care of the patient were reviewed by me and considered in my medical decision making (see chart for details).    MDM Rules/Calculators/A&P                     59 year old female with known Covid positive contact presents today with 4 days of cough and 2-3 days of central chest pain only with cough.  She denies any shortness of breath or hemoptysis.  On arrival she is febrile, no tachycardia or hypoxia on room air.  She is well-appearing and in no acute distress, cranial nerves intact, airway patent without swelling, no meningeal signs, heart regular rate and rhythm, lungs clear, abdomen soft nontender without peritoneal signs, neurovascular intact to all 4 extremities without evidence of DVT. - Rapid Covid: Positive High-sensitivity troponin: 7; within normal limits Lipase within normal limits Urine pregnancy negative CBC nonacute CMP nonacute DG chest:  IMPRESSION:  Patchy right basilar opacity concerning for pneumonia.   CT Angio Chest PE Study: IMPRESSION:  No evidence of pulmonary embolus.    Airspace disease in the right lower lobe compatible with pneumonia.  Minimal ground-glass densities peripherally in the left lower lobe  could reflect early pneumonia.   EKG: Sinus rhythm Probable left atrial enlargement no prior available for comparison Confirmed by Tilden Fossa [phone]) on 02/07/2019 10:26:53 AM - Imaging and labs reviewed.  High-sensitivity troponin within normal limits, EKG without evidence of ischemia, low suspicion for ACS, no indication for delta troponin.  Suspect patient's pain today  is musculoskeletal due to her cough.  No evidence of PE on CT scan today, additionally doubt dissection based on history and work-up as above.  Suspect patient's cough and symptoms are secondary to COVID-19, she has no hypoxia or tachycardia with ambulation with nursing staff.  She is well-appearing and in no acute distress.  There is no indication for admission or further ED work-up at this time.  She was given IV fluids as well as begun on Rocephin/azithromycin for concern of possible bacterial pneumonia.  Will  discharge patient with continued azithromycin 250 mg daily x4 days.  Patient reassessed resting comfortably no acute distress vital signs stable on room air.  She states understanding of diagnoses and work-up as above she has no further questions and is agreeable for discharge and PCP follow-up with azithromycin  At this time there does not appear to be any evidence of an acute emergency medical condition and the patient appears stable for discharge with appropriate outpatient follow up. Diagnosis was discussed with patient who verbalizes understanding of care plan and is agreeable to discharge. I have discussed return precautions with patient who verbalizes understanding of return precautions. Patient encouraged to follow-up with their PCP. All questions answered.  Patient's case discussed with Dr. Madilyn Hook who agrees with discharge with Azithromycin.  Hannah Simpson was evaluated in Emergency Department on 02/07/2019 for the symptoms described in the history of present illness. She was evaluated in the context of the global COVID-19 pandemic, which necessitated consideration that the patient might be at risk for infection with the SARS-CoV-2 virus that causes COVID-19. Institutional protocols and algorithms that pertain to the evaluation of patients at risk for COVID-19 are in a state of rapid change based on information released by regulatory bodies including the CDC and federal and state organizations.  These policies and algorithms were followed during the patient's care in the ED.   Note: Portions of this report may have been transcribed using voice recognition software. Every effort was made to ensure accuracy; however, inadvertent computerized transcription errors may still be present. Final Clinical Impression(s) / ED Diagnoses Final diagnoses:  COVID-19 virus detected  Community acquired pneumonia of right lower lobe of lung    Rx / DC Orders ED Discharge Orders         Ordered    azithromycin (ZITHROMAX) 250 MG tablet  Daily     02/07/19 1242           Elizabeth Palau 02/07/19 1245    Tilden Fossa, MD 02/08/19 [phone]

## 2019-02-07 NOTE — ED Triage Notes (Addendum)
Cough x4 days.  Granddaughter tested positive for Covid.  Pt sts she gets pneumonia every January.

## 2019-02-08 ENCOUNTER — Telehealth: Payer: Self-pay | Admitting: Nurse Practitioner

## 2019-02-08 NOTE — Telephone Encounter (Signed)
Called to Discuss with patient about Covid symptoms and the use of bamlanivimab, a monoclonal antibody infusion for those with mild to moderate Covid symptoms and at a high risk of hospitalization.     Pt is qualified for this infusion at the [facility] due to co-morbid conditions and/or a member of an at-risk group.     Unable to reach pt  

## 2019-02-12 ENCOUNTER — Encounter (HOSPITAL_BASED_OUTPATIENT_CLINIC_OR_DEPARTMENT_OTHER): Payer: Self-pay | Admitting: *Deleted

## 2019-02-12 ENCOUNTER — Emergency Department (HOSPITAL_BASED_OUTPATIENT_CLINIC_OR_DEPARTMENT_OTHER)
Admission: EM | Admit: 2019-02-12 | Discharge: 2019-02-12 | Disposition: A | Discharge status: Home / Self Care | Payer: Medicaid Other | Attending: Emergency Medicine | Admitting: Emergency Medicine

## 2019-02-12 ENCOUNTER — Other Ambulatory Visit: Payer: Self-pay

## 2019-02-12 DIAGNOSIS — Z87891 Personal history of nicotine dependence: Secondary | ICD-10-CM | POA: Diagnosis not present

## 2019-02-12 DIAGNOSIS — Z79899 Other long term (current) drug therapy: Secondary | ICD-10-CM | POA: Insufficient documentation

## 2019-02-12 DIAGNOSIS — R05 Cough: Secondary | ICD-10-CM | POA: Insufficient documentation

## 2019-02-12 DIAGNOSIS — I10 Essential (primary) hypertension: Secondary | ICD-10-CM | POA: Insufficient documentation

## 2019-02-12 DIAGNOSIS — U071 COVID-19: Secondary | ICD-10-CM | POA: Insufficient documentation

## 2019-02-12 DIAGNOSIS — R519 Headache, unspecified: Secondary | ICD-10-CM | POA: Insufficient documentation

## 2019-02-12 DIAGNOSIS — R059 Cough, unspecified: Secondary | ICD-10-CM

## 2019-02-12 LAB — CBC WITH DIFFERENTIAL/PLATELET
Abs Immature Granulocytes: 0.01 10*3/uL (ref 0.00–0.07)
Basophils Absolute: 0 10*3/uL (ref 0.0–0.1)
Basophils Relative: 1 %
Eosinophils Absolute: 0.1 10*3/uL (ref 0.0–0.5)
Eosinophils Relative: 2 %
HCT: 40.8 % (ref 36.0–46.0)
Hemoglobin: 13.1 g/dL (ref 12.0–15.0)
Immature Granulocytes: 0 %
Lymphocytes Relative: 38 %
Lymphs Abs: 2.3 10*3/uL (ref 0.7–4.0)
MCH: 28 pg (ref 26.0–34.0)
MCHC: 32.1 g/dL (ref 30.0–36.0)
MCV: 87.2 fL (ref 80.0–100.0)
Monocytes Absolute: 0.5 10*3/uL (ref 0.1–1.0)
Monocytes Relative: 7 %
Neutro Abs: 3.2 10*3/uL (ref 1.7–7.7)
Neutrophils Relative %: 52 %
Platelets: 409 10*3/uL — ABNORMAL HIGH (ref 150–400)
RBC: 4.68 MIL/uL (ref 3.87–5.11)
RDW: 15 % (ref 11.5–15.5)
WBC: 6 10*3/uL (ref 4.0–10.5)
nRBC: 0 % (ref 0.0–0.2)

## 2019-02-12 LAB — COMPREHENSIVE METABOLIC PANEL
ALT: 18 U/L (ref 0–44)
AST: 26 U/L (ref 15–41)
Albumin: 3.6 g/dL (ref 3.5–5.0)
Alkaline Phosphatase: 86 U/L (ref 38–126)
Anion gap: 12 (ref 5–15)
BUN: 11 mg/dL (ref 6–20)
CO2: 24 mmol/L (ref 22–32)
Calcium: 8.7 mg/dL — ABNORMAL LOW (ref 8.9–10.3)
Chloride: 99 mmol/L (ref 98–111)
Creatinine, Ser: 0.83 mg/dL (ref 0.44–1.00)
GFR calc Af Amer: >60 mL/min (ref >60)
GFR calc non Af Amer: >60 mL/min (ref >60)
Glucose, Bld: 108 mg/dL — ABNORMAL HIGH (ref 70–99)
Potassium: 3.6 mmol/L (ref 3.5–5.1)
Sodium: 135 mmol/L (ref 135–145)
Total Bilirubin: 0.3 mg/dL (ref 0.3–1.2)
Total Protein: 7.9 g/dL (ref 6.5–8.1)

## 2019-02-12 MED ORDER — BENZONATATE 100 MG PO CAPS: 100.0000 mg | ORAL_CAPSULE | Freq: Three times a day (TID) | ORAL | 0 refills | Status: AC | PRN

## 2019-02-12 MED ORDER — DROPERIDOL 2.5 MG/ML IJ SOLN
1.2500 mg | Freq: Once | INTRAMUSCULAR | Status: AC
  Administered 2019-02-12: 1.25 mg via INTRAVENOUS
  Filled 2019-02-12: qty 2

## 2019-02-12 MED ORDER — ACETAMINOPHEN 325 MG PO TABS
650.0000 mg | ORAL_TABLET | Freq: Once | ORAL | Status: AC
  Administered 2019-02-12: 650 mg via ORAL
  Filled 2019-02-12: qty 2

## 2019-02-12 MED ORDER — PROMETHAZINE HCL 25 MG PO TABS: 25.0000 mg | ORAL_TABLET | Freq: Three times a day (TID) | ORAL | 0 refills | Status: AC | PRN

## 2019-02-12 NOTE — ED Triage Notes (Signed)
Covid positive 5 days ago. Here today with a headache and cough. She is crying at triage.

## 2019-02-12 NOTE — ED Provider Notes (Signed)
[facility] EMERGENCY DEPARTMENT Provider Note   CSN: [id number] Arrival date & time: 02/12/19  1049     History Chief Complaint  Patient presents with  . Cough    COVID+  . Headache    Hannah Simpson is a 59 y.o. female.  The history is provided by the patient and medical records. No language interpreter was used.   Hannah Simpson is a 59 y.o. female who presents to the Emergency Department complaining of HA, cough. She began feeling poorly on January 7 with cough and fever and presented to the emergency department for evaluation. She was diagnosed with COVID-19 infection at that time. The next day she developed a right sided headache that was gradual and onset. She presents today due to progressive worsening of the headache cough. She states that cough has significantly worsened and every time she coughs her headache becomes much more severe. She continues to take Tylenol for her fevers but they keep returning. She has mild shortness of breath as well. She denies any nausea, vomiting, diarrhea, loss of taste or smell, numbness, weakness. Symptoms are severe, constant, worsening.     Past Medical History:  Diagnosis Date  . Hypercholesteremia   . Hypertension   . Pneumonia     There are no problems to display for this patient.   Past Surgical History:  Procedure Laterality Date  . TUBAL LIGATION       OB History   No obstetric history on file.     No family history on file.  Social History   Tobacco Use  . Smoking status: Former Games developer  . Smokeless tobacco: Never Used  Substance Use Topics  . Alcohol use: No  . Drug use: No    Home Medications Prior to Admission medications   Medication Sig Start Date End Date Taking? Authorizing Provider  hydrochlorothiazide (HYDRODIURIL) 25 MG tablet TK 1 T PO D 05/01/17  Yes [provider]  omeprazole (PRILOSEC) 40 MG capsule TK 1 C PO D 03/05/17  Yes [provider]  azithromycin (ZITHROMAX) 250  MG tablet Take 1 tablet (250 mg total) by mouth daily. You have already been given your first dose of antibiotics today.  You may begin taking the antibiotic azithromycin as prescribed 1 pill daily starting on January 8 until complete. 02/08/19   Harlene Salts A, PA-C  benzonatate (TESSALON) 100 MG capsule Take 1 capsule (100 mg total) by mouth 3 (three) times daily as needed for cough. 02/12/19   Tilden Fossa, MD  promethazine (PHENERGAN) 25 MG tablet Take 1 tablet (25 mg total) by mouth every 8 (eight) hours as needed for nausea or vomiting. 02/12/19   Tilden Fossa, MD    Allergies    Latex  Review of Systems   Review of Systems  All other systems reviewed and are negative.   Physical Exam Updated Vital Signs BP 135/85 (BP Location: Right Arm)   Pulse 90   Temp 98.5 F (36.9 C) (Oral)   Resp 15   Ht 5\' 9"  (1.753 m)   Wt 103 kg   LMP 01/24/2019   SpO2 96%   BMI 33.52 kg/m   Physical Exam Vitals and nursing note reviewed.  Constitutional:      General: She is in acute distress.     Appearance: She is well-developed.     Comments: Tearful, uncomfortable appearing  HENT:     Head: Normocephalic and atraumatic.     Right Ear: Tympanic membrane normal.  Left Ear: Tympanic membrane normal.     Mouth/Throat:     Mouth: Mucous membranes are moist.  Eyes:     Extraocular Movements: Extraocular movements intact.     Pupils: Pupils are equal, round, and reactive to light.  Cardiovascular:     Rate and Rhythm: Normal rate and regular rhythm.  Pulmonary:     Effort: Pulmonary effort is normal. No respiratory distress.  Abdominal:     Palpations: Abdomen is soft.     Tenderness: There is no guarding or rebound.     Comments: Mild generalized abdominal tenderness  Musculoskeletal:        General: No tenderness.     Cervical back: Neck supple.  Skin:    General: Skin is warm and dry.  Neurological:     Mental Status: She is alert and oriented to person, place, and  time.     Comments: No asymmetry of facial movements. Five out of five strength in all four extremities with sensation to light touch intact in all four extremities  Psychiatric:     Comments: Anxious appearing     ED Results / Procedures / Treatments   Labs (all labs ordered are listed, but only abnormal results are displayed) Labs Reviewed  COMPREHENSIVE METABOLIC PANEL - Abnormal; Notable for the following components:      Result Value   Glucose, Bld 108 (*)    Calcium 8.7 (*)    All other components within normal limits  CBC WITH DIFFERENTIAL/PLATELET - Abnormal; Notable for the following components:   Platelets 409 (*)    All other components within normal limits    EKG None  Radiology No results found.  Procedures Procedures (including critical care time)  Medications Ordered in ED Medications  droperidol (INAPSINE) 2.5 MG/ML injection 1.25 mg (1.25 mg Intravenous Given 02/12/19 1217)  acetaminophen (TYLENOL) tablet 650 mg (650 mg Oral Given 02/12/19 1216)    ED Course  I have reviewed the triage vital signs and the nursing notes.  Pertinent labs & imaging results that were available during my care of the patient were reviewed by me and considered in my medical decision making (see chart for details).    MDM Rules/Calculators/A&P                     Patient with recent diagnosis of COVID-19 infection here for evaluation of progressive right sided headache and cough. She is non-toxic appearing on evaluation with no respiratory distress. She has no focal neurologic deficits. She was treated with medications in the emergency department. On repeat assessment she is feeling it significantly improved with resolution of her headache. Presentation is not consistent with meningitis, subarachnoid hemorrhage, bacterial pneumonia, PE. Discussed with patient home care for headache, coughing COVID-19 infection. Discussed continuing home isolation, outpatient follow-up and return  precautions.  Hannah Simpson was evaluated in Emergency Department on 02/12/2019 for the symptoms described in the history of present illness. She was evaluated in the context of the global COVID-19 pandemic, which necessitated consideration that the patient might be at risk for infection with the SARS-CoV-2 virus that causes COVID-19. Institutional protocols and algorithms that pertain to the evaluation of patients at risk for COVID-19 are in a state of rapid change based on information released by regulatory bodies including the CDC and federal and state organizations. These policies and algorithms were followed during the patient's care in the ED.  Final Clinical Impression(s) / ED Diagnoses Final diagnoses:  COVID-19 virus infection  Cough  Bad headache    Rx / DC Orders ED Discharge Orders         Ordered    benzonatate (TESSALON) 100 MG capsule  3 times daily PRN     02/12/19 1431    promethazine (PHENERGAN) 25 MG tablet  Every 8 hours PRN     02/12/19 1431           Tilden Fossa, MD 02/12/19 1502

## 2019-02-12 NOTE — ED Notes (Signed)
pts oxygen saturations remained 93% with ambulation to tx room 11.

## 2021-10-02 IMAGING — CT CT ANGIO CHEST
2 of 8 series · 19 of 36 positions shown · IV contrast (omnipaque)
Comparison: None.

CLINICAL DATA: Cough.  Shortness of breath.  COVID positive.

EXAM:
CT ANGIOGRAPHY CHEST WITH CONTRAST
TECHNIQUE: Multidetector CT imaging of the chest was performed using the
standard protocol during bolus administration of intravenous
contrast. Multiplanar CT image reconstructions and MIPs were
obtained to evaluate the vascular anatomy.
CONTRAST:  100mL OMNIPAQUE IOHEXOL 350 MG/ML SOLN

[Series 6: pe coronal mpr · coronal · 0.52mm/px · 1 of 146 slices shown]
[im 73/146  mediastinal]
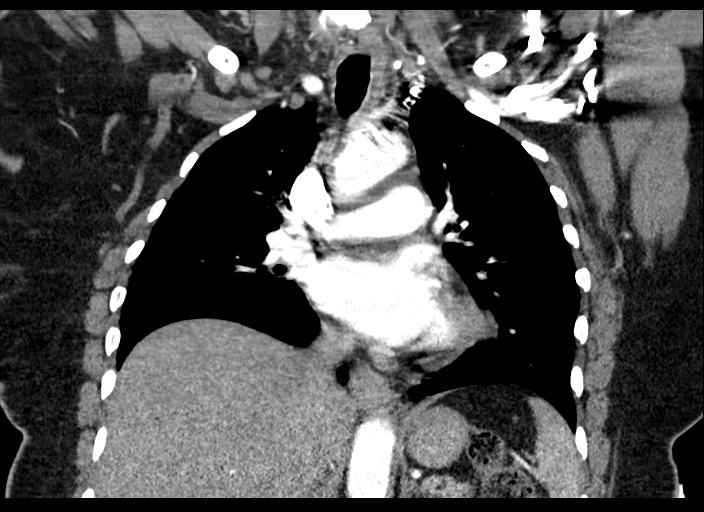

[Series 10: pe thins · axial · 0.63mm/px · z∈[-261,-35]mm · 18 of 254 slices shown]
[im 14/254  lung]
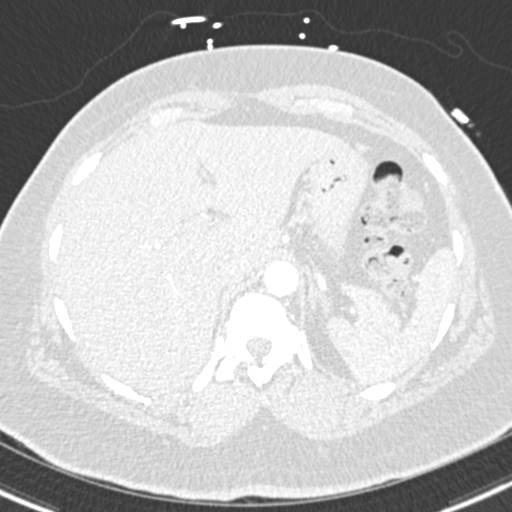
[im 27/254  mediastinal]
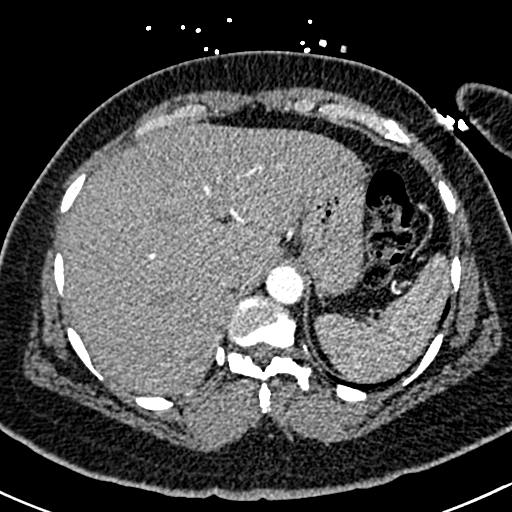
[im 40/254  lung]
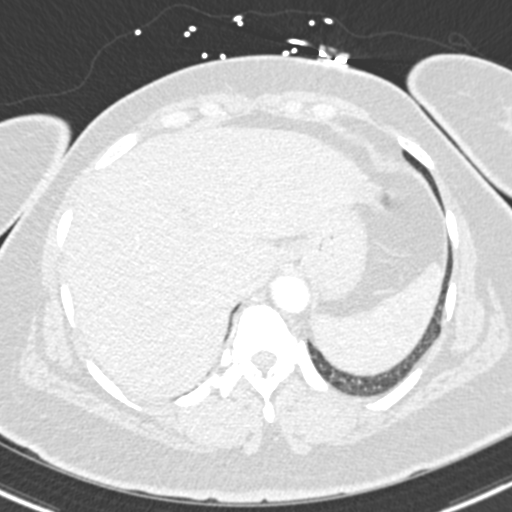
[im 54/254  mediastinal]
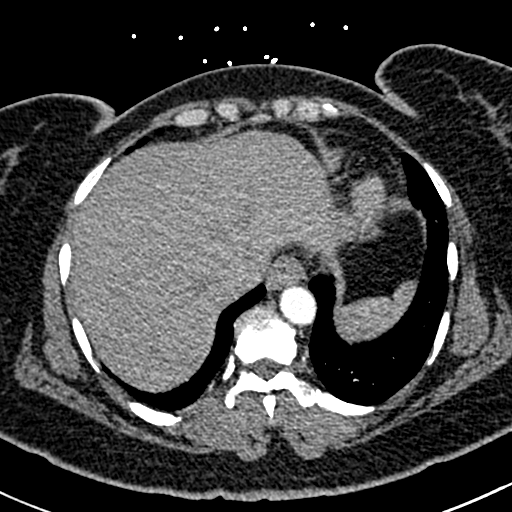
[im 67/254  lung]
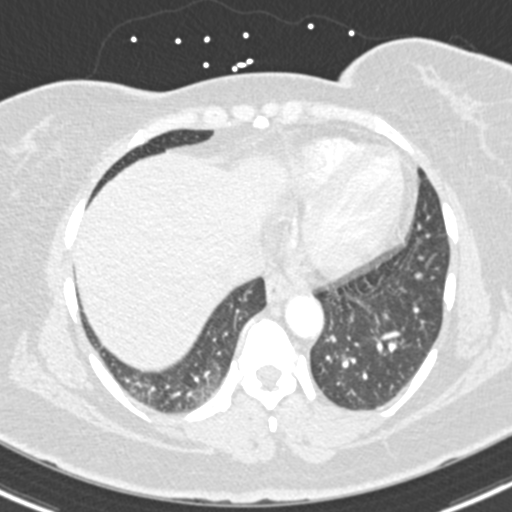
[im 80/254  mediastinal]
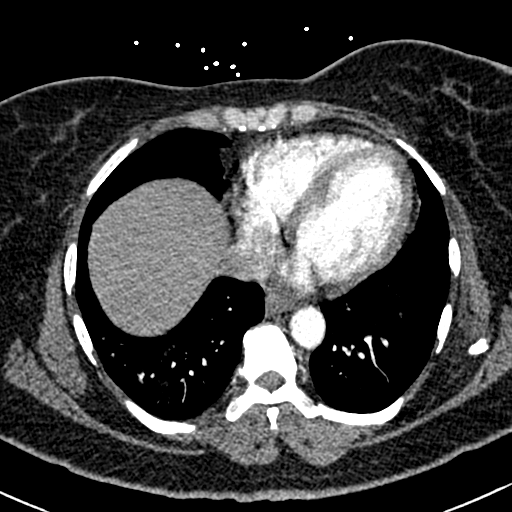
[im 94/254  lung]
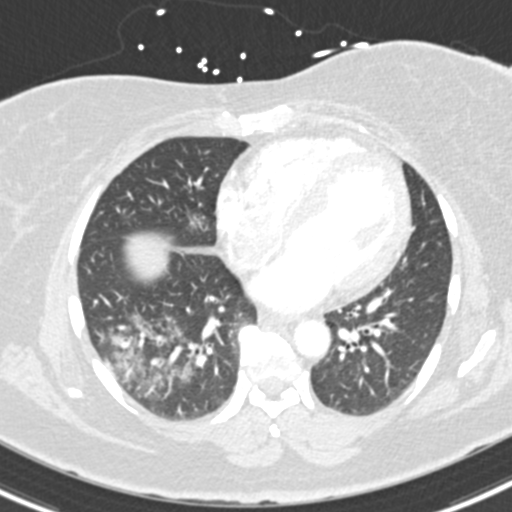
[im 107/254  mediastinal]
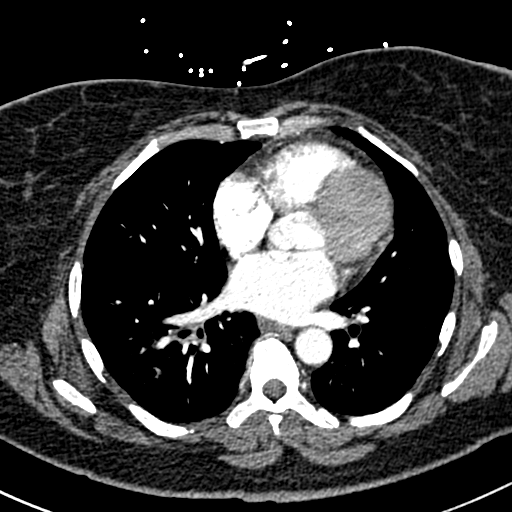
[im 120/254  lung]
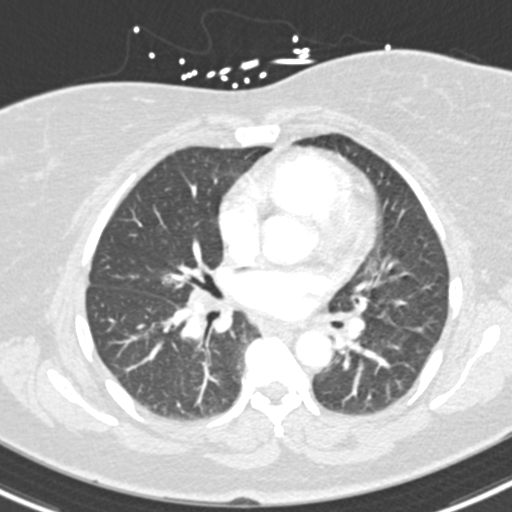
[im 134/254  mediastinal]
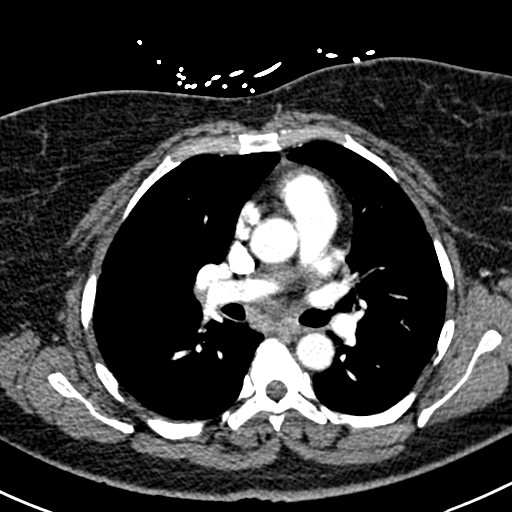
[im 147/254  lung]
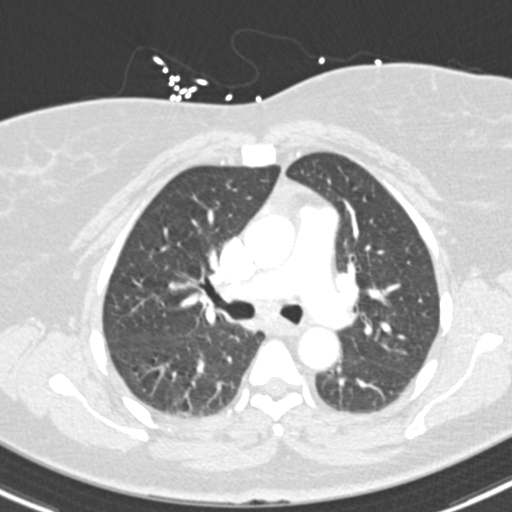
[im 160/254  mediastinal]
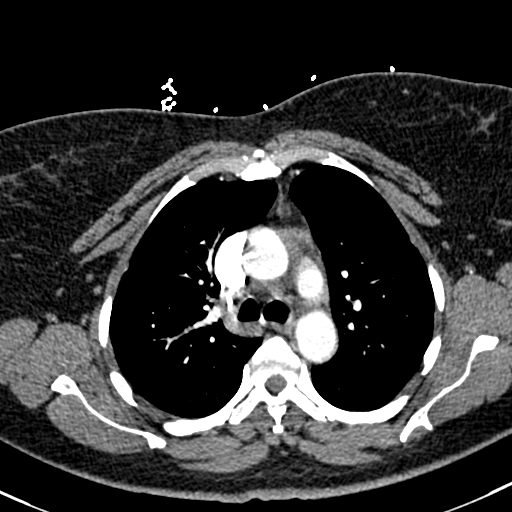
[im 174/254  lung]
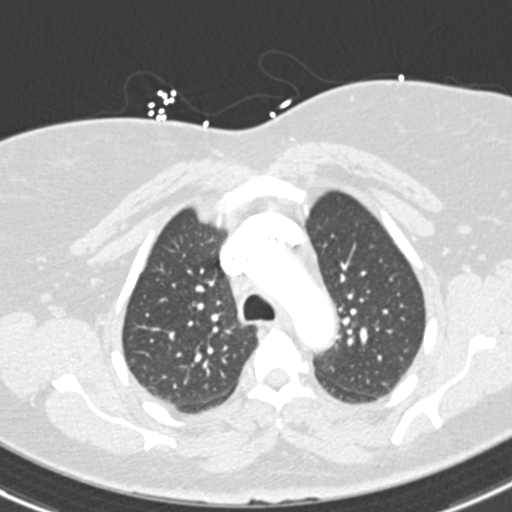
[im 187/254  mediastinal]
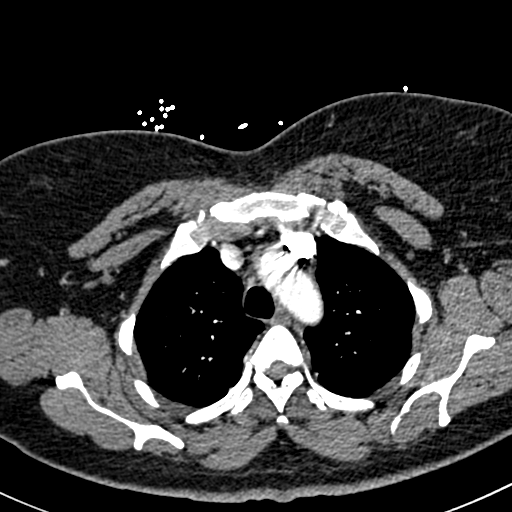
[im 200/254  lung]
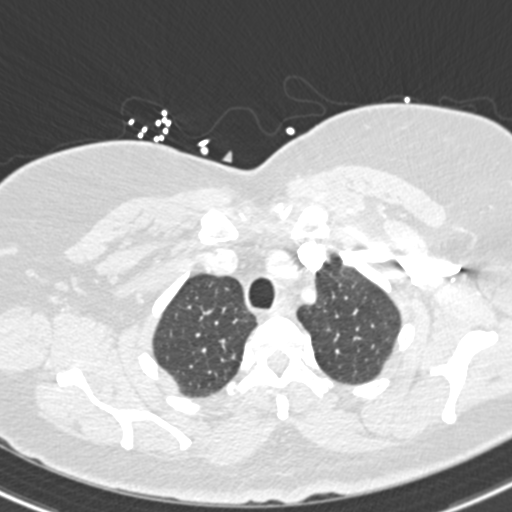
[im 214/254  mediastinal]
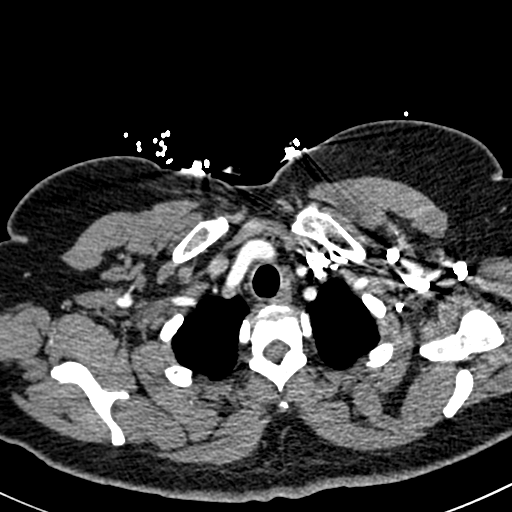
[im 227/254  lung]
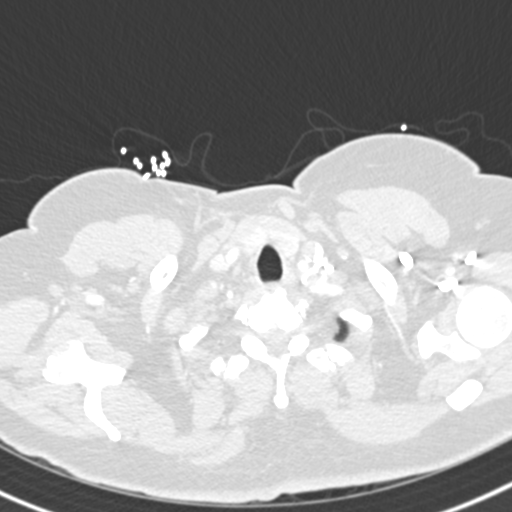
[im 240/254  mediastinal]
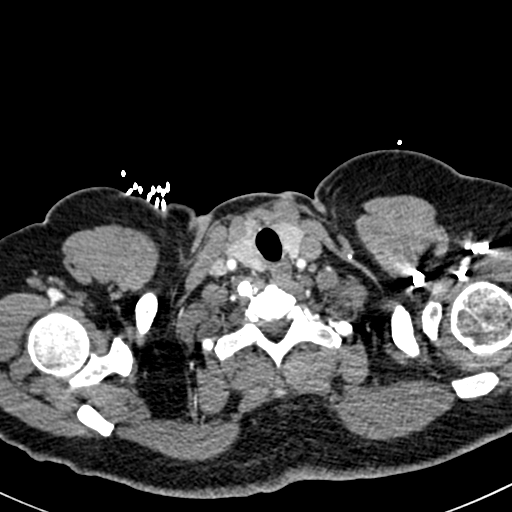

[19 of 36 positions shown; findings below may reference images not displayed]

FINDINGS: Cardiovascular: No filling defects in the pulmonary arteries to
suggestpulmonary emboli. Heart is normal size. Aorta is normal
caliber.

Mediastinum/Nodes: No mediastinal, hilar, or axillary adenopathy.
Trachea and esophagus are unremarkable. Thyroid unremarkable.

Lungs/Pleura: Airspace disease noted in the right lower lobe
compatible with pneumonia. Minimal ground-glass densities in the
left lower lobe could reflect early infiltrate/pneumonia. No
effusions or pneumothorax.

Upper Abdomen: Imaging into the upper abdomen shows no acute
findings.

Musculoskeletal: Chest wall soft tissues are unremarkable. No acute
bony abnormality.

Review of the MIP images confirms the above findings.
IMPRESSION: No evidence of pulmonary embolus.

Airspace disease in the right lower lobe compatible with pneumonia.
Minimal ground-glass densities peripherally in the left lower lobe
could reflect early pneumonia.

## 2023-03-22 ENCOUNTER — Emergency Department (HOSPITAL_BASED_OUTPATIENT_CLINIC_OR_DEPARTMENT_OTHER): Payer: Medicaid Other

## 2023-03-22 ENCOUNTER — Emergency Department (HOSPITAL_BASED_OUTPATIENT_CLINIC_OR_DEPARTMENT_OTHER)
Admission: EM | Admit: 2023-03-22 | Discharge: 2023-03-22 | Disposition: A | Discharge status: Home / Self Care | Payer: Medicaid Other | Attending: Emergency Medicine | Admitting: Emergency Medicine

## 2023-03-22 ENCOUNTER — Encounter (HOSPITAL_BASED_OUTPATIENT_CLINIC_OR_DEPARTMENT_OTHER): Payer: Self-pay

## 2023-03-22 ENCOUNTER — Other Ambulatory Visit: Payer: Self-pay

## 2023-03-22 DIAGNOSIS — J039 Acute tonsillitis, unspecified: Secondary | ICD-10-CM | POA: Diagnosis not present

## 2023-03-22 DIAGNOSIS — Z91040 Latex allergy status: Secondary | ICD-10-CM | POA: Insufficient documentation

## 2023-03-22 DIAGNOSIS — J029 Acute pharyngitis, unspecified: Secondary | ICD-10-CM | POA: Diagnosis present

## 2023-03-22 DIAGNOSIS — I10 Essential (primary) hypertension: Secondary | ICD-10-CM | POA: Insufficient documentation

## 2023-03-22 LAB — CBC WITH DIFFERENTIAL/PLATELET
Abs Immature Granulocytes: 0.07 10*3/uL (ref 0.00–0.07)
Basophils Absolute: 0.1 10*3/uL (ref 0.0–0.1)
Basophils Relative: 1 %
Eosinophils Absolute: 0.1 10*3/uL (ref 0.0–0.5)
Eosinophils Relative: 1 %
HCT: 40.2 % (ref 36.0–46.0)
Hemoglobin: 13.1 g/dL (ref 12.0–15.0)
Immature Granulocytes: 1 %
Lymphocytes Relative: 38 %
Lymphs Abs: 3.6 10*3/uL (ref 0.7–4.0)
MCH: 29.5 pg (ref 26.0–34.0)
MCHC: 32.6 g/dL (ref 30.0–36.0)
MCV: 90.5 fL (ref 80.0–100.0)
Monocytes Absolute: 0.7 10*3/uL (ref 0.1–1.0)
Monocytes Relative: 7 %
Neutro Abs: 5 10*3/uL (ref 1.7–7.7)
Neutrophils Relative %: 52 %
Platelets: 370 10*3/uL (ref 150–400)
RBC: 4.44 MIL/uL (ref 3.87–5.11)
RDW: 14.5 % (ref 11.5–15.5)
WBC: 9.6 10*3/uL (ref 4.0–10.5)
nRBC: 0 % (ref 0.0–0.2)

## 2023-03-22 LAB — BASIC METABOLIC PANEL
Anion gap: 9 (ref 5–15)
BUN: 9 mg/dL (ref 6–20)
CO2: 24 mmol/L (ref 22–32)
Calcium: 8.9 mg/dL (ref 8.9–10.3)
Chloride: 105 mmol/L (ref 98–111)
Creatinine, Ser: 0.76 mg/dL (ref 0.44–1.00)
GFR, Estimated: >60 mL/min (ref >60)
Glucose, Bld: 103 mg/dL — ABNORMAL HIGH (ref 70–99)
Potassium: 3.8 mmol/L (ref 3.5–5.1)
Sodium: 138 mmol/L (ref 135–145)

## 2023-03-22 LAB — RESP PANEL BY RT-PCR (RSV, FLU A&B, COVID)  RVPGX2
Influenza A by PCR: NEGATIVE
Influenza B by PCR: NEGATIVE
Resp Syncytial Virus by PCR: POSITIVE — AB
SARS Coronavirus 2 by RT PCR: NEGATIVE

## 2023-03-22 MED ORDER — AMOXICILLIN-POT CLAVULANATE 875-125 MG PO TABS: 1.0000 | ORAL_TABLET | Freq: Two times a day (BID) | ORAL | 0 refills | Status: AC

## 2023-03-22 MED ORDER — IOHEXOL 300 MG/ML  SOLN
75.0000 mL | Freq: Once | INTRAMUSCULAR | Status: AC | PRN
  Administered 2023-03-22: 75 mL via INTRAVENOUS

## 2023-03-22 MED ORDER — KETOROLAC TROMETHAMINE 15 MG/ML IJ SOLN
15.0000 mg | Freq: Once | INTRAMUSCULAR | Status: AC
  Administered 2023-03-22: 15 mg via INTRAVENOUS
  Filled 2023-03-22: qty 1

## 2023-03-22 MED ORDER — DEXAMETHASONE SODIUM PHOSPHATE 10 MG/ML IJ SOLN
10.0000 mg | Freq: Once | INTRAMUSCULAR | Status: AC
  Administered 2023-03-22: 10 mg via INTRAVENOUS
  Filled 2023-03-22: qty 1

## 2023-03-22 MED ORDER — SODIUM CHLORIDE 0.9 % IV SOLN
3.0000 g | Freq: Once | INTRAVENOUS | Status: AC
  Administered 2023-03-22: 3 g via INTRAVENOUS

## 2023-03-22 NOTE — ED Notes (Signed)
 ED Provider at bedside.

## 2023-03-22 NOTE — ED Notes (Signed)
 Patient transported to CT

## 2023-03-22 NOTE — ED Triage Notes (Signed)
Pt c/o left ear pain & sore throat on left side that started 3 days ago.

## 2023-03-22 NOTE — ED Provider Notes (Signed)
[facility] Provider Note   CSN: [id number] Arrival date & time: 03/22/23  2104     History  Chief Complaint  Patient presents with   Otalgia   Sore Throat    Rethel Sebek is a 63 y.o. female.   Otalgia Sore Throat  63 year old female history of hypertension, hyperlipidemia presenting for sore throat.  She is a few days sore throat worse than left as well as some ear pain and congestion.  No fevers or chills.  Pain with swallowing but is able to swallow.  No shortness of breath or chest pain.  No headache or neck stiffness.     Home Medications Prior to Admission medications   Medication Sig Start Date End Date Taking? Authorizing Provider  amoxicillin-clavulanate (AUGMENTIN) 875-125 MG tablet Take 1 tablet by mouth every 12 (twelve) hours. 03/22/23  Yes Laurence Spates, MD  azithromycin (ZITHROMAX) 250 MG tablet Take 1 tablet (250 mg total) by mouth daily. You have already been given your first dose of antibiotics today.  You may begin taking the antibiotic azithromycin as prescribed 1 pill daily starting on January 8 until complete. 02/08/19   Harlene Salts A, PA-C  benzonatate (TESSALON) 100 MG capsule Take 1 capsule (100 mg total) by mouth 3 (three) times daily as needed for cough. 02/12/19   Tilden Fossa, MD  hydrochlorothiazide (HYDRODIURIL) 25 MG tablet TK 1 T PO D 05/01/17   [provider]  omeprazole (PRILOSEC) 40 MG capsule TK 1 C PO D 03/05/17   [provider]  promethazine (PHENERGAN) 25 MG tablet Take 1 tablet (25 mg total) by mouth every 8 (eight) hours as needed for nausea or vomiting. 02/12/19   Tilden Fossa, MD      Allergies    Latex    Review of Systems   Review of Systems  HENT:  Positive for ear pain.   Review of systems completed and notable as per HPI.  ROS otherwise negative.   Physical Exam Updated Vital Signs BP (!) 152/92 (BP Location: Right Arm)   Pulse 73   Temp 99.2 F  (37.3 C)   Resp 16   Ht 5\' 9"  (1.753 m)   Wt 93.9 kg   SpO2 99%   BMI 30.57 kg/m  Physical Exam Vitals and nursing note reviewed.  Constitutional:      General: She is not in acute distress.    Appearance: She is well-developed.  HENT:     Head: Normocephalic and atraumatic.     Right Ear: Tympanic membrane and ear canal normal.     Left Ear: Ear canal normal.     Ears:     Comments: Small effusion on the left tympanic membrane, no erythema    Mouth/Throat:     Mouth: Mucous membranes are moist.     Tonsils: No tonsillar exudate.     Comments: Patient is difficult to examine, she has prominent gag reflex.  Uvula appears midline.  She has some slight fullness to the left tonsillar pillar but unable to fully visualize.  She has left-sided cervical lymphadenopathy.  No trismus.  No submandibular or sublingual swelling. Eyes:     Conjunctiva/sclera: Conjunctivae normal.  Cardiovascular:     Rate and Rhythm: Normal rate and regular rhythm.     Pulses: Normal pulses.     Heart sounds: Normal heart sounds. No murmur heard. Pulmonary:     Effort: Pulmonary effort is normal. No respiratory distress.  Breath sounds: Normal breath sounds.  Abdominal:     Palpations: Abdomen is soft.     Tenderness: There is no abdominal tenderness.  Musculoskeletal:        General: No swelling.     Cervical back: Normal range of motion and neck supple.  Skin:    General: Skin is warm and dry.     Capillary Refill: Capillary refill takes less than 2 seconds.  Neurological:     Mental Status: She is alert.  Psychiatric:        Mood and Affect: Mood normal.     ED Results / Procedures / Treatments   Labs (all labs ordered are listed, but only abnormal results are displayed) Labs Reviewed  RESP PANEL BY RT-PCR (RSV, FLU A&B, COVID)  RVPGX2 - Abnormal; Notable for the following components:      Result Value   Resp Syncytial Virus by PCR POSITIVE (*)    All other components within normal  limits  BASIC METABOLIC PANEL - Abnormal; Notable for the following components:   Glucose, Bld 103 (*)    All other components within normal limits  CBC WITH DIFFERENTIAL/PLATELET    EKG None  Radiology CT Soft Tissue Neck W Contrast Result Date: 03/22/2023 CLINICAL DATA:  Sore throat EXAM: CT NECK WITH CONTRAST TECHNIQUE: Multidetector CT imaging of the neck was performed using the standard protocol following the bolus administration of intravenous contrast. RADIATION DOSE REDUCTION: This exam was performed according to the departmental dose-optimization program which includes automated exposure control, adjustment of the mA and/or kV according to patient size and/or use of iterative reconstruction technique. CONTRAST:  75mL OMNIPAQUE IOHEXOL 300 MG/ML  SOLN COMPARISON:  None Available. FINDINGS: PHARYNX AND LARYNX: Enlarged left palatine tonsil without peritonsillar abscess. No retropharyngeal abnormality. Normal larynx. SALIVARY GLANDS: Normal parotid, submandibular and sublingual glands. THYROID: Normal. LYMPH NODES: No enlarged or abnormal density lymph nodes. VASCULAR: Major cervical vessels are patent. LIMITED INTRACRANIAL: Normal. VISUALIZED ORBITS: Normal. MASTOIDS AND VISUALIZED PARANASAL SINUSES: No fluid levels or advanced mucosal thickening. No mastoid effusion. SKELETON: No bony spinal canal stenosis. No lytic or blastic lesions. UPPER CHEST: Clear. OTHER: None. IMPRESSION: Enlarged left palatine tonsil without peritonsillar abscess. Electronically Signed   By: Deatra Robinson M.D.   On: 03/22/2023 22:28    Procedures Procedures    Medications Ordered in ED Medications  dexamethasone (DECADRON) injection 10 mg (10 mg Intravenous Given 03/22/23 2135)  Ampicillin-Sulbactam (UNASYN) 3 g in sodium chloride 0.9 % 100 mL IVPB (0 g Intravenous Stopped 03/22/23 2220)  iohexol (OMNIPAQUE) 300 MG/ML solution 75 mL (75 mLs Intravenous Contrast Given 03/22/23 2207)  ketorolac (TORADOL) 15 MG/ML  injection 15 mg (15 mg Intravenous Given 03/22/23 2242)    ED Course/ Medical Decision Making/ A&P                                 Medical Decision Making Amount and/or Complexity of Data Reviewed Labs: ordered. Radiology: ordered.  Risk Prescription drug management.   Medical Decision Making:   Phoenyx Melka is a 63 y.o. female who presented to the ED today with sore throat, ear pain provide signs reviewed.  On exam she has some slight fullness to the left peritonsillar area concerning for possible peritonsillar abscess.  She is difficult to examine due to prominent gag reflex.  She has no stridor or evidence of airway compromise and she is tolerating her secretions.  She  does report some slight voice change.  Will obtain CT scan to evaluate for deep space infection, peritonsillar abscess.  Will give Decadron and a dose of antibiotics here.   Patient placed on continuous vitals and telemetry monitoring while in ED which was reviewed periodically.  Reviewed and confirmed nursing documentation for past medical history, family history, social history.  Reassessment and Plan:   CT scan reviewed and interpreted.  No signs of PTA but does show evidence of tonsillar enlargement likely due to tonsillitis.  After Decadron and Toradol she is feeling a lot better.  She is tolerant p.o. without difficulty.  No airway compromise.  Tolerating her secretions and able to eat and drink here.  Voice has normalized.  She is feeling a lot better and would like to go home.  I think this is reasonable.  Will treat her for tonsillitis with Augmentin and have her follow close with her PCP.  Return precautions given.   Patient's presentation is most consistent with acute complicated illness / injury requiring diagnostic workup.           Final Clinical Impression(s) / ED Diagnoses Final diagnoses:  Tonsillitis    Rx / DC Orders ED Discharge Orders          Ordered    amoxicillin-clavulanate  (AUGMENTIN) 875-125 MG tablet  Every 12 hours        03/22/23 2243              Laurence Spates, MD 03/22/23 2310

## 2023-03-22 NOTE — Discharge Instructions (Addendum)
Your CT scan showed inflammation of your tonsil likely due to infection.  You are being placed on antibiotics and need to complete the entire course.  If you develop difficulty breathing, inability to swallow, severe pain, persistent fevers or any other new concerning symptoms you need to return to the ED.  You need to follow-up closely with your primary care doctor.

## 2023-04-18 ENCOUNTER — Ambulatory Visit: Admitting: Physician Assistant

## 2023-04-18 ENCOUNTER — Other Ambulatory Visit (INDEPENDENT_AMBULATORY_CARE_PROVIDER_SITE_OTHER): Payer: Self-pay

## 2023-04-18 ENCOUNTER — Encounter: Payer: Self-pay | Admitting: Physician Assistant

## 2023-04-18 DIAGNOSIS — M25562 Pain in left knee: Secondary | ICD-10-CM

## 2023-04-18 LAB — POCT GLYCOSYLATED HEMOGLOBIN (HGB A1C): Hemoglobin A1C: 5.8 % — AB (ref 4.0–5.6)

## 2023-04-18 MED ORDER — METHYLPREDNISOLONE ACETATE 40 MG/ML IJ SUSP
40.0000 mg | INTRAMUSCULAR | Status: AC | PRN
Start: 2023-04-18 — End: 2023-04-18
  Administered 2023-04-18: 40 mg via INTRA_ARTICULAR

## 2023-04-18 MED ORDER — LIDOCAINE HCL 1 % IJ SOLN
3.0000 mL | INTRAMUSCULAR | Status: AC | PRN
Start: 2023-04-18 — End: 2023-04-18
  Administered 2023-04-18: 3 mL

## 2023-04-18 NOTE — Progress Notes (Signed)
 Office Visit Note   Patient: Hannah Simpson           Date of Birth: 1961-01-30           MRN: [id number] Visit Date: 04/18/2023              Requested by: Jonna Clark, MD [address] [place],  [place] [address] PCP: [facility]   Assessment & Plan: Visit Diagnoses:  1. Left knee pain, unspecified chronicity     Plan: Patient is a pleasant 63 year old woman with a long history of bilateral but mostly left knee pain.  She comes in today complaining of left greater than right knee pain.  She denies any injury.  She has not really done any treatment.  Most of the pain is anterior and she does have some grinding with range of motion.  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  We talked about doing a steroid injection she would like to do this today.  She does have a history of diabetes and is treated with Ozempic I cannot find any recent A1c or glucose reading.  Will go forward with A1c reading today which was 5.8.  Went forward with injection she may follow-up in 2 weeks if she wishes to have the other knee injected  Follow-Up Instructions: No follow-ups on file.   Orders:  Orders Placed This Encounter  Procedures  . XR KNEE 3 VIEW LEFT  . Ambulatory referral to Physical Therapy   No orders of the defined types were placed in this encounter.     Procedures: Large Joint Inj: L knee on 04/18/2023 11:11 AM Indications: pain and diagnostic evaluation Details: 25 G 1.5 in needle, anteromedial approach  Arthrogram: No  Medications: 40 mg methylPREDNISolone acetate 40 MG/ML; 3 mL lidocaine 1 % Outcome: tolerated well, no immediate complications Procedure, treatment alternatives, risks and benefits explained, specific risks discussed. Consent was given by the patient.     Clinical Data: No additional findings.   Subjective: Chief Complaint  Patient presents with  . Left Knee - Pain    HPI  Review of Systems   All other systems reviewed and are negative.    Objective: Vital Signs: There were no vitals taken for this visit.  Physical Exam Constitutional:      Appearance: Normal appearance.  Pulmonary:     Effort: Pulmonary effort is normal.  Neurological:     General: No focal deficit present.     Mental Status: She is alert and oriented to person, place, and time.  Psychiatric:        Mood and Affect: Mood normal.        Behavior: Behavior normal.    Ortho Exam  Specialty Comments:  No specialty comments available.  Imaging: XR KNEE 3 VIEW LEFT Result Date: 04/18/2023 Radiographs of the left knee demonstrate well-maintained alignment she does have some sclerotic changes and some degenerative changes mostly noted under the patella    PMFS History: There are no active problems to display for this patient.  Past Medical History:  Diagnosis Date  . Hypercholesteremia   . Hypertension   . Pneumonia     History reviewed. No pertinent family history.  Past Surgical History:  Procedure Laterality Date  . TONSILLECTOMY    . TUBAL LIGATION     Social History   Occupational History  . Not on file  Tobacco Use  . Smoking status: Former  . Smokeless tobacco: Never  Vaping Use  . Vaping status: Never Used  Substance and Sexual Activity  . Alcohol use: No  . Drug use: No  . Sexual activity: Not on file

## 2023-05-15 ENCOUNTER — Ambulatory Visit: Attending: Physician Assistant | Admitting: Physical Therapy

## 2023-05-15 NOTE — Therapy (Deleted)
 OUTPATIENT PHYSICAL THERAPY LOWER EXTREMITY EVALUATION   Patient Name: Shekia Kuper MRN: [id number] DOB:06-10-1960, 63 y.o., female Today's Date: 05/15/2023  END OF SESSION:   Past Medical History:  Diagnosis Date   Hypercholesteremia    Hypertension    Pneumonia    Past Surgical History:  Procedure Laterality Date   TONSILLECTOMY     TUBAL LIGATION     There are no active problems to display for this patient.   PCP: None indicated on chart  REFERRING PROVIDER: Persons, Norma Beckers, [place]   REFERRING DIAG: [phone] (ICD-10-CM) - Left knee pain, unspecified chronicity   THERAPY DIAG:  No diagnosis found.  Rationale for Evaluation and Treatment: Rehabilitation  ONSET DATE: ***  SUBJECTIVE:   SUBJECTIVE STATEMENT: ***  PERTINENT HISTORY: Per H&P on 04/18/23: Patient is a pleasant 63 year old woman with a long history of bilateral but mostly left knee pain. She comes in today complaining of left greater than right knee pain. She denies any injury. She has not really done any treatment. Most of the pain is anterior and she does have some grinding with range of motion. 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   PAIN:  Are you having pain? {OPRCPAIN:27236}  PRECAUTIONS: {Therapy precautions:24002}  RED FLAGS: {PT Red Flags:29287}   WEIGHT BEARING RESTRICTIONS: {Yes ***/No:24003}  FALLS:  Has patient fallen in last 6 months? {fallsyesno:27318}  LIVING ENVIRONMENT: Lives with: {OPRC lives with:25569::"lives with their family"} Lives in: {Lives in:[address]70} Stairs: {opstairs:27293} Has following equipment at home: {Assistive devices:23999}  OCCUPATION: ***  PLOF: {PLOF:24004}  PATIENT GOALS: ***  NEXT MD VISIT: ***  OBJECTIVE:  Note: Objective measures were completed at Evaluation unless otherwise noted.  DIAGNOSTIC FINDINGS: ***  PATIENT SURVEYS:  {rehab surveys:24030}  COGNITION: Overall cognitive status:  {cognition:24006}     SENSATION: {sensation:27233}  EDEMA:  {edema:24020}  MUSCLE LENGTH: Hamstrings: Right *** deg; Left *** deg Andy Bannister test: Right *** deg; Left *** deg  POSTURE: {posture:25561}  PALPATION: ***  LOWER EXTREMITY ROM:  {AROM/PROM:27142} ROM Right eval Left eval  Hip flexion    Hip extension    Hip abduction    Hip adduction    Hip internal rotation    Hip external rotation    Knee flexion    Knee extension    Ankle dorsiflexion    Ankle plantarflexion    Ankle inversion    Ankle eversion     (Blank rows = not tested)  LOWER EXTREMITY MMT:  MMT Right eval Left eval  Hip flexion    Hip extension    Hip abduction    Hip adduction    Hip internal rotation    Hip external rotation    Knee flexion    Knee extension    Ankle dorsiflexion    Ankle plantarflexion    Ankle inversion    Ankle eversion     (Blank rows = not tested)  LOWER EXTREMITY SPECIAL TESTS:  {LEspecialtests:26242}  FUNCTIONAL TESTS:  {Functional tests:24029}  GAIT: Distance walked: *** Assistive device utilized: {Assistive devices:23999} Level of assistance: {Levels of assistance:24026} Comments: ***  TREATMENT DATE: ***    PATIENT EDUCATION:  Education details: *** Person educated: {Person educated:25204} Education method: {Education Method:25205} Education comprehension: {Education Comprehension:25206}  HOME EXERCISE PROGRAM: ***  ASSESSMENT:  CLINICAL IMPRESSION: Patient is a 63 y.o. *** who was seen today for physical therapy evaluation and treatment for ***.   OBJECTIVE IMPAIRMENTS: {opptimpairments:25111}.   ACTIVITY LIMITATIONS: {activitylimitations:27494}  PARTICIPATION LIMITATIONS: {participationrestrictions:25113}  PERSONAL FACTORS: {Personal factors:25162} are also affecting patient's functional outcome.    REHAB POTENTIAL: {rehabpotential:25112}  CLINICAL DECISION MAKING: {clinical decision making:25114}  EVALUATION COMPLEXITY: {Evaluation complexity:25115}   GOALS: Goals reviewed with patient? {yes/no:20286}  SHORT TERM GOALS: Target date: *** *** Baseline: Goal status: INITIAL  2.  *** Baseline:  Goal status: INITIAL  3.  *** Baseline:  Goal status: INITIAL  4.  *** Baseline:  Goal status: INITIAL  5.  *** Baseline:  Goal status: INITIAL  6.  *** Baseline:  Goal status: INITIAL  LONG TERM GOALS: Target date: ***  *** Baseline:  Goal status: INITIAL  2.  *** Baseline:  Goal status: INITIAL  3.  *** Baseline:  Goal status: INITIAL  4.  *** Baseline:  Goal status: INITIAL  5.  *** Baseline:  Goal status: INITIAL  6.  *** Baseline:  Goal status: INITIAL   PLAN:  PT FREQUENCY: {rehab frequency:25116}  PT DURATION: {rehab duration:25117}  PLANNED INTERVENTIONS: {rehab planned interventions:25118::"97110-Therapeutic exercises","97530- Therapeutic [phone]- Neuromuscular re-education","97535- Self FAOZ","30865- Manual therapy"}  PLAN FOR NEXT SESSION: ***   Alise Calais April Ma L Yulanda Diggs, PT 05/15/2023, 7:54 AM

## 2023-06-12 ENCOUNTER — Ambulatory Visit: Attending: Physician Assistant | Admitting: Physical Therapy

## 2023-06-12 NOTE — Therapy (Deleted)
 OUTPATIENT PHYSICAL THERAPY LOWER EXTREMITY EVALUATION   Patient Name: Hannah Simpson MRN: [id number] DOB:08-May-1960, 63 y.o., female Today's Date: 06/12/2023  END OF SESSION:   Past Medical History:  Diagnosis Date   Hypercholesteremia    Hypertension    Pneumonia    Past Surgical History:  Procedure Laterality Date   TONSILLECTOMY     TUBAL LIGATION     There are no active problems to display for this patient.   PCP: [facility]  REFERRING PROVIDER: Persons, Norma Beckers, [place]  REFERRING DIAG: [phone] (ICD-10-CM) - Left knee pain, unspecified chronicity  THERAPY DIAG:  No diagnosis found.  Rationale for Evaluation and Treatment: Rehabilitation  ONSET DATE: ***  SUBJECTIVE:   SUBJECTIVE STATEMENT: ***  PERTINENT HISTORY: *** PAIN:  Are you having pain? {OPRCPAIN:27236}  PRECAUTIONS: {Therapy precautions:24002}  RED FLAGS: {PT Red Flags:29287}   WEIGHT BEARING RESTRICTIONS: {Yes ***/No:24003}  FALLS:  Has patient fallen in last 6 months? {fallsyesno:27318}  LIVING ENVIRONMENT: Lives with: {[facility] lives with:25569::"lives with their family"} Lives in: {Lives in:[address]} Stairs: {opstairs:27293} Has following equipment at home: {Assistive devices:23999}  OCCUPATION: ***  PLOF: {PLOF:24004}  PATIENT GOALS: ***  NEXT MD VISIT: ***  OBJECTIVE:  Note: Objective measures were completed at Evaluation unless otherwise noted.  DIAGNOSTIC FINDINGS:  XR KNEE 3 VIEW LEFT Result Date: 04/18/2023 Radiographs of the left knee demonstrate well-maintained alignment she does have some sclerotic changes and some degenerative changes mostly noted under the patella  PATIENT SURVEYS:  {rehab surveys:24030}  COGNITION: Overall cognitive status: {cognition:24006}     SENSATION: {sensation:27233}  EDEMA:  {edema:24020}  MUSCLE LENGTH: Hamstrings: Right *** deg; Left *** deg Andy Bannister test: Right *** deg; Left *** deg  POSTURE:  {posture:25561}  PALPATION: ***  LOWER EXTREMITY ROM:  {AROM/PROM:27142} ROM Right eval Left eval  Hip flexion    Hip extension    Hip abduction    Hip adduction    Hip internal rotation    Hip external rotation    Knee flexion    Knee extension    Ankle dorsiflexion    Ankle plantarflexion    Ankle inversion    Ankle eversion     (Blank rows = not tested)  LOWER EXTREMITY MMT:  MMT Right eval Left eval  Hip flexion    Hip extension    Hip abduction    Hip adduction    Hip internal rotation    Hip external rotation    Knee flexion    Knee extension    Ankle dorsiflexion    Ankle plantarflexion    Ankle inversion    Ankle eversion     (Blank rows = not tested)  LOWER EXTREMITY SPECIAL TESTS:  {LEspecialtests:26242}  FUNCTIONAL TESTS:  {Functional tests:24029}  GAIT: Distance walked: *** Assistive device utilized: {Assistive devices:23999} Level of assistance: {Levels of assistance:24026} Comments: ***  TREATMENT DATE: ***    PATIENT EDUCATION:  Education details: *** Person educated: {Person educated:25204} Education method: {Education Method:25205} Education comprehension: {Education Comprehension:25206}  HOME EXERCISE PROGRAM: ***  ASSESSMENT:  CLINICAL IMPRESSION: Patient is a *** y.o. *** who was seen today for physical therapy evaluation and treatment for ***.   OBJECTIVE IMPAIRMENTS: {opptimpairments:25111}.   ACTIVITY LIMITATIONS: {activitylimitations:27494}  PARTICIPATION LIMITATIONS: {participationrestrictions:25113}  PERSONAL FACTORS: {Personal factors:25162} are also affecting patient's functional outcome.   REHAB POTENTIAL: {rehabpotential:25112}  CLINICAL DECISION MAKING: {clinical decision making:25114}  EVALUATION COMPLEXITY: {Evaluation complexity:25115}   GOALS: Goals reviewed with  patient? {yes/no:20286}  SHORT TERM GOALS: Target date: *** *** Baseline: Goal status: INITIAL  2.  *** Baseline:  Goal status: INITIAL  3.  *** Baseline:  Goal status: INITIAL  4.  *** Baseline:  Goal status: INITIAL  5.  *** Baseline:  Goal status: INITIAL  6.  *** Baseline:  Goal status: INITIAL  LONG TERM GOALS: Target date: ***  *** Baseline:  Goal status: INITIAL  2.  *** Baseline:  Goal status: INITIAL  3.  *** Baseline:  Goal status: INITIAL  4.  *** Baseline:  Goal status: INITIAL  5.  *** Baseline:  Goal status: INITIAL  6.  *** Baseline:  Goal status: INITIAL   PLAN:  PT FREQUENCY: {rehab frequency:25116}  PT DURATION: {rehab duration:25117}  PLANNED INTERVENTIONS: {rehab planned interventions:25118::"97110-Therapeutic exercises","97530- Therapeutic [phone]- Neuromuscular re-education","97535- Self MWNU","27253- Manual therapy"}  PLAN FOR NEXT SESSION: ***   Aaliyah Gavel April Ma L Aissa Lisowski, PT 06/12/2023, 7:56 AM

## 2023-12-04 ENCOUNTER — Encounter: Payer: Self-pay | Admitting: Radiology
